# Patient Record
Sex: Female | Born: 1960 | Race: Black or African American | Hispanic: No | Smoking: Never smoker
Health system: Southern US, Community
[De-identification: ages and names within clinical notes are randomized; demographics above are authoritative.]

## PROBLEM LIST (undated history)

## (undated) DIAGNOSIS — B009 Herpesviral infection, unspecified: Secondary | ICD-10-CM

## (undated) DIAGNOSIS — R3129 Other microscopic hematuria: Secondary | ICD-10-CM

## (undated) DIAGNOSIS — E7800 Pure hypercholesterolemia, unspecified: Secondary | ICD-10-CM

## (undated) DIAGNOSIS — M1990 Unspecified osteoarthritis, unspecified site: Secondary | ICD-10-CM

## (undated) DIAGNOSIS — J45909 Unspecified asthma, uncomplicated: Secondary | ICD-10-CM

## (undated) DIAGNOSIS — L309 Dermatitis, unspecified: Secondary | ICD-10-CM

## (undated) HISTORY — DX: Herpesviral infection, unspecified: B00.9

## (undated) HISTORY — DX: Other microscopic hematuria: R31.29

## (undated) HISTORY — PX: FRACTURE SURGERY: SHX138

## (undated) HISTORY — DX: Unspecified asthma, uncomplicated: J45.909

---

## 1997-08-06 HISTORY — PX: HIP FRACTURE SURGERY: SHX118

## 1997-08-06 HISTORY — PX: WRIST FRACTURE SURGERY: SHX121

## 1998-11-14 HISTORY — PX: MYOMECTOMY ABDOMINAL APPROACH: SUR870

## 2001-12-19 ENCOUNTER — Encounter: Payer: Self-pay | Admitting: Obstetrics

## 2001-12-19 ENCOUNTER — Ambulatory Visit (HOSPITAL_COMMUNITY): Admission: RE | Admit: 2001-12-19 | Discharge: 2001-12-19 | Payer: Self-pay | Admitting: Obstetrics

## 2001-12-24 ENCOUNTER — Encounter: Payer: Self-pay | Admitting: Obstetrics

## 2001-12-24 ENCOUNTER — Encounter: Admission: RE | Admit: 2001-12-24 | Discharge: 2001-12-24 | Payer: Self-pay | Admitting: Obstetrics

## 2003-02-03 ENCOUNTER — Encounter: Payer: Self-pay | Admitting: Obstetrics

## 2003-02-03 ENCOUNTER — Ambulatory Visit (HOSPITAL_COMMUNITY): Admission: RE | Admit: 2003-02-03 | Discharge: 2003-02-03 | Payer: Self-pay | Admitting: Obstetrics

## 2004-06-22 ENCOUNTER — Ambulatory Visit (HOSPITAL_COMMUNITY): Admission: RE | Admit: 2004-06-22 | Discharge: 2004-06-22 | Payer: Self-pay | Admitting: Obstetrics

## 2005-07-06 ENCOUNTER — Ambulatory Visit (HOSPITAL_COMMUNITY): Admission: RE | Admit: 2005-07-06 | Discharge: 2005-07-06 | Payer: Self-pay | Admitting: Obstetrics

## 2006-04-05 ENCOUNTER — Other Ambulatory Visit: Admission: RE | Admit: 2006-04-05 | Discharge: 2006-04-05 | Payer: Self-pay | Admitting: Obstetrics & Gynecology

## 2007-06-30 ENCOUNTER — Other Ambulatory Visit: Admission: RE | Admit: 2007-06-30 | Discharge: 2007-06-30 | Payer: Self-pay | Admitting: Obstetrics and Gynecology

## 2007-11-27 ENCOUNTER — Emergency Department (HOSPITAL_COMMUNITY): Admission: EM | Admit: 2007-11-27 | Discharge: 2007-11-27 | Payer: Self-pay | Admitting: Family Medicine

## 2008-03-22 HISTORY — PX: LAPAROSCOPIC TOTAL HYSTERECTOMY: SUR800

## 2008-03-23 ENCOUNTER — Ambulatory Visit (HOSPITAL_COMMUNITY): Admission: RE | Admit: 2008-03-23 | Discharge: 2008-03-24 | Payer: Self-pay | Admitting: Obstetrics & Gynecology

## 2008-03-23 ENCOUNTER — Encounter: Payer: Self-pay | Admitting: Obstetrics & Gynecology

## 2008-05-06 ENCOUNTER — Ambulatory Visit (HOSPITAL_COMMUNITY): Admission: RE | Admit: 2008-05-06 | Discharge: 2008-05-06 | Payer: Self-pay | Admitting: Obstetrics & Gynecology

## 2008-06-11 ENCOUNTER — Emergency Department (HOSPITAL_COMMUNITY): Admission: EM | Admit: 2008-06-11 | Discharge: 2008-06-11 | Payer: Self-pay | Admitting: Emergency Medicine

## 2010-12-19 NOTE — Op Note (Signed)
Terry Salazar, Terry Salazar NO.:  [id number]   MEDICAL RECORD NO.:  [id number]          PATIENT TYPE:  AMB   LOCATION:  DAY                          FACILITY:  [facility]   PHYSICIAN:  M. Leda Quail, MD  DATE OF BIRTH:  [birth year]   DATE OF PROCEDURE:  03/23/2008  DATE OF DISCHARGE:                               OPERATIVE REPORT   PREOPERATIVE DIAGNOSES:  35. A 50 year old G3, P1, A2 African American female with fibroid      uterus.  2. Menorrhagia.  3. Anemia.  4. Prolapsed cervix.  5. History of myomectomy and round ligament uterine suspension done in      2000 in [place], [place].   POSTOPERATIVE DIAGNOSES:  100. A 50 year old G89, P55, A2 African American female with fibroid      uterus.  2. Menorrhagia.  3. Anemia.  4. Prolapsed cervix.  5. History of myomectomy and round ligament uterine suspension done in      2000 in [place], [place].   PROCEDURE:  Total laparoscopic hysterectomy using the Da Vinci system.   SURGEON:  M. Leda Quail, M.D. .   ASSISTANT:  Genia Del, M.D.   ANESTHESIA:  General endotracheal.   FINDINGS:  Large globular uterus with round ligament sutures to the  anterior abdominal wall from the round ligament suspension that was done  in 2000.   SPECIMENS:  Uterus and cervix sent to pathology.   ESTIMATED BLOOD LOSS:  50 mL.   URINE OUTPUT:  400 mL of clear urine output in the Foley catheter.   FLUIDS:  24 mL of LR.   COMPLICATIONS:  None.   INDICATIONS:  Ms. Kanno is a very nice, 50 year old, G2, P78, A2,  African American female with multiple fibroids in her uterus, resulting  menorrhagia, and anemia.  She has been taking oral contraceptives for  control of this, but she continues to have very heavy cycles and needs  to take iron to keep from getting very anemic.  Most recently, her  hemoglobin was around 11.  She did have a myomectomy performed in  [place] in 2000, but since that time, she has had a other fibroids  that  has continued to grow.  She reports that during that surgery she was  told that there were other smaller fibroids that may cause trouble in  the future.  She now over time has developed a prolapsed cervix which is  quite long, but the uterus is being held in place vaginally because of  its size.  We have discussed treatment options, and I have recommended a  TLH with leaving the uterosacral ligaments and keeping these attached to  the vaginal cuff.  The patient been counseled about risks and benefits.  Due to the size of the uterus, I do not feel comfortable that I can do  this laparoscopically or a LAVH or vaginally.  We have gone through also  other alternatives including a myomectomy or uterine artery  embolization, but she is ready at this time for definitive management.  The patient presents for this today.   PROCEDURE:  The patient is taken to the operating room.  Running IV is  present in the right arm.  She is placed in the supine position.  General endotracheal anesthesia was administered by the anesthesia staff  without difficulty.  Legs were positioned in the low lithotomy position  in [place] stirrups.  Legs are then positioned in high lithotomy position.  Abdomen, perineum, inner thighs and vagina were prepped in normal  sterile fashion.  The patient is then draped in a normal sterile  fashion.  Attention is turned initially toward the vagina.  A heavy  weighted speculum was the posterior aspect of the vagina.  The anterior  lip of cervix was grasped with a single-tooth tenaculum.  Uterus sounds  to 12 cm.  The Rumi uterine manipulator was obtained, and a #10 tip is  obtained.  This was attached to the [facility] uterine manipulator.  The bulb  did inflate easily with 5 mL of normal saline.  The vaginal occlusive  device was placed over top of this and then a small co-ring.  The  anterior lip of cervix was grasped with a single-tooth tenaculum, and  the uterus was sounded up  to 22.  The co-ring with the tip passed  through the endocervical canal and into the uterus.  Co-ring was then  placed snugly around the cervix, and the bulb was inflated inside the  uterus to hold the co-ring and Rumi uterine manipulator in place.  The  vaginal occlusive device is inflated at this time and placed vaginally.  Sixty mL of air is inflated in the vaginal occlusive device.  The Foley  catheter was inserted in the bladder and placed to straight drain.  Clear urine was noted.   Legs were then positioned to low-lithotomy position as far as they will  go.  Attention was turned toward the abdomen.  The location for the  camera report is identified.  Skin was anesthetized with 0.25% Marcaine  and a 12-mm skin incision made with a knife.  Subcutaneous and fat  tissue was dissected.  Curved S retractors were used to visualize the  fascia.  The fascia was grasped with Kocher clamps and elevated.  The  fascia was incised sharply with curved Mayo scissors.  The peritoneum  was identified, elevated and incised sharply with Metzenbaum scissors.  The operator's index fingers were placed in this incision.  A curved S  retractor was then placed to follow.  Intraperitoneal placement was  noted, and there was no omental or bowel adhesions noted.  Then, a  pursestring suture was placed on the fascia.  The midline port was then  placed.  This was held in place with the pursestring suture.  The  pneumoperitoneum was initially achieved under low pressures.  Once the  scope was used to visualize the pelvis, high pressures were then used to  maintain pneumoperitoneum.  Port sites were chosen for the 3 arms of the  assistant port.  These were placed in a semicircular fashion coming  about 10 cm out from the umbilical incision and then curving about 10 cm  out from the umbilical incision and then curving down about 10 cm on  each side.  The side ports are well above the level of the anterior  iliac  crest.  The skin was anesthetized with 0.25% Marcaine, and then 8-  mm skin incisions were made, 2 on the left side and the closest to the  location on the right, and then an 10-mm  skin incision was made for the  assistant port was further down on the right side.  Then under direct  visualization of the scope, three 8-mm robotic ports were placed.  These  were nonbladed tipped trocars.  Once they are placed under direct  visualization, the black thick line on the ports are placed just inside  the incisions.  Through the fourth incision was then placed a 12-mm  assistant port.  This was a nonbladed trocar as well, and this was  placed under direct visualization.  At this point, the patient was  placed in steep Trendelenburg.  The robot is docked from the side in  normal standard fashion, and the right #1 port was a monopolar scissors  and the left #2 port was PK bipolar cautery.  The third port was a Child psychotherapist, and the fourth is the assistant port.   At this point, the operator was moved to the console, and using the Da  Vinci system, the procedure was initiated.  The ureters were identified  bilaterally.  The round ligament on the right side was serially  cauterized, clamped and incised until it was completely transected.  The  broad ligament of the uterus was then incised to isolate the right utero-  ovarian pedicle.  This was serially clamped, cauterized and incised.  Sound indicator was used to ensure complete hemostasis of the pedicle.  Once this was performed, the anterior and posterior leaf of the broad  ligament on the right side were opened.  The anterior leaf was brought  down to the level of the internal os of the midline to begin the  creation of the bladder flap.  The posterior leaf of broad ligament was  then brought down posteriorly to the level of the uterosacral ligament.  The right uterine artery was well visualized at this point.  Attention  was then turned to the  left side.  In a similar fashion, the left round  ligament was serially clamped, transected and cauterized.  The left  uterine ovarian ligament was isolated, at this point, was serially  clamped, cauterized and transected.  The anterior and posterior leaf of  the peritoneum was then opened at this point.  The anterior leaf was  brought down to the level of the midline of the internal os, and using  sharp and blunt dissection, the filmy tissue between the bladder and the  cervix was dissected to open this plane and dissect the bladder off the  cervix without difficulty.  Once the bladder dissection was well below  the level of the co-ring, posterior leaf of broad ligament on the left  side was then opened, and this allowed the ureter drop laterally.  The  uterine artery at this point was well visualized on the left side.  It  was initially clamped at the level of the internal os.  This was above  the level of the co-ring.  It was clamped, cauterized and incised.  Then, keeping close to the cervix in the area where the broad ligament  is located, the PK Jola Babinski was clamped tight and cauterized and then  incised to bring the uterine artery down further on the cervix and  outside the level of the co-ring.  Attention was turned to the right  side.  In a similar fashion, the uterine artery was serially clamped and  cauterized at the level of the internal os.  Then, it was cut again at  the level of the cervix  high.  The PK [place] was used to clamp the  beginning of the broad ligament, cauterized and then incised in this  location as well.  This brought the uterine artery just outside the  level of the co-ring bilaterally.  Ureters were noted bilaterally  peristalsing out further and more lateral where this dissection  occurred.  Then using monopolar polar cautery, keeping on the co-ring  anteriorly, the colpotomy was begun.  This was carried around to the  right side of the uterus inside the  level of the uterine artery  dissection, then around posteriorly and then back to the left side,  again keeping inside the level of the uterine artery dissection.  Once  the cervix was completely freed from the vagina, the instruments were  removed, and the camera was brought back into the sleeve.  The patient  was kept in steep Trendelenburg.  The #1 arm was undocked.  Legs were  positioned in the dorsal lithotomy position.  Attention was turned  toward the vagina.  Sterile gown and gloves were applied to the  operator.  Heavy weighted speculum was placed in the vagina, and the  curved Deaver's were used as sidewall retractors.  The Rumi uterine  manipulator tip was deflated, and the co-ring was removed as well as the  vaginal occlusive device.  Vaginal occlusive device was taken off the  Rumi uterine manipulator to be used later.  Then, the cervix was cored  out with a #10 blade to begin morcellation of the uterus.  With care  taken to watch the sidewalls of the vagina, the uterus was morcellated  in pieces with fibroids being brought out.  Once the uterus was  morcellated to a small enough size, the entire remainder of the uterus  could be delivered through the vagina.  Once this was done, the vaginal  occlusive device was placed back inside the vagina.  The legs were  brought back down to the low-lithotomy position.  The first arm was  redocked on the patient, and the operator went back to the console.  The  pneumoperitoneum had been kept throughout the vaginal portion of this  procedure.  In the right arm at this point, a suture cut was obtained,  in the left arm, a needle driver and then finally in the third arm a PK  Jola Babinski.  Then, figure-of-eight sutures were placed at the vaginal  corners to begin the closing of the cuff.  Care was taken below the  location of the bladder anteriorly.  Anterior and posterior vaginal  mucosa were incorporated into all stitches.  Three additional  figure-of-  eight sutures were used to completely close the cuff vaginally.  The  pelvis was irrigated.  No bleeding was noted.  Ureter peristalsis was  noted bilaterally.  Excellent hemostasis was noted of all pedicles.  At  this point, all instruments were removed from the abdomen.  The  pneumoperitoneum was initially released, and the cuff was visualized  without any bleeding being noted.  Then, the assistant port and 3  additional ports were removed under direct visualization of the  laparoscope.  Laparoscope was removed from the midline, and finally the  patient was completely taken out of Trendelenburg.  The midline port was  removed.  C.R.N.A. gave the patient several deep breaths to try and get  any air out the abdomen.  Then, this port was removed.  The operator's  index finger was placed in the incision to ensure that  there was no  bowel present.  The pursestring suture was then tied tightly to close  this incision.  The incisions were cleansed, and the Bovie was used to  stop any bleeding inside the incisions.  The 2 large incisions were  closed with a subcuticular stitch of 3-0 Vicryl.  The rest of the  incisions were then closed with Dermabond.  At this point, the procedure  was ended except for the vaginal occlusive device needed to be removed  which was done without difficulty.   The patient was placed back in supine position.  She was awakened from  anesthesia without difficulty and extubated.  Sponge, lap, needle and  instrument counts were correct x2.  The patient was taken to the  recovery room in stable condition.      Lum Keas, MD  Electronically Signed     MSM/MEDQ  D:  03/23/2008  T:  03/23/2008  Job:  [id number]

## 2010-12-19 NOTE — Discharge Summary (Signed)
NAMEDOTTY, GONZALO NO.:  [id number]   MEDICAL RECORD NO.:  [id number]          PATIENT TYPE:  OIB   LOCATION:  1530                         [facility]AC[facility]   PHYSICIAN:  M. Leda Quail, MD  DATE OF BIRTH:  Jan 09, 1961   DATE OF ADMISSION:  03/23/2008  DATE OF DISCHARGE:  03/24/2008                               DISCHARGE SUMMARY   ADMISSION DIAGNOSES:  54. A 50 year old G3, P1, A2 African American female with fibroid      uterus.  2. Menorrhagia.  3. Prolapsed cervix.  4. History of myomectomy.  5. Anemia.   DISCHARGE DIAGNOSES:  36. A 50 year old G46, P74, A2 African American female with fibroid      uterus.  2. Menorrhagia.  3. Prolapsed cervix.  4. History of myomectomy.  5. Anemia.   PROCEDURE:  Total laparoscopic hysterectomy using the [employer] system.   HISTORY OF PRESENT ILLNESS:  A written H&P is in the chart but in brief,  Ms. Kessen is a very nice 50 year old African American female who has  been dealing with fibroids and menorrhagia for many years.  In 2000, she  underwent a myomectomy as well as a round ligament suspension because  she was having issues then with uterine prolapse.  She was told then  that she would probably have to have something done later because of the  number of fibroids that were in the uterus.  At that time, however, the  2 largest ones were removes and the smaller ones were left.  She has  been on birth control for cycle control and this has improved her cycle  some but she still does have definite menorrhagia and has had trouble  with anemia.  She does take iron and multivitamins to help prevent this.  Over the last year or two, she has had more issues with prolapsed  cervix.  Because of the size of her uterus, she does not have uterus  prolapse.  The cervix has gotten quite long.  We discussed treatment  options and based on the 2 issues of the prolapsed cervix and her  enlarged uterus secondary to fibroids and  menorrhagia, I recommended  preceding either with the myomectomy again or a hysterectomy.  She is  done with childbearing and is not in a long-term relationship at this  time.  She is not interested in having any more children and is ready to  proceed with hysterectomy.  She had been counseled about the risks and  benefits and this is all documented in her office chart.   HOSPITAL COURSE:  Patient was admitted through same day surgery.  She  was taken to the operating room where a TLH using the [employer] system  was performed.  Blood loss for the surgery was 50 mL.  She was very  stable during the procedure.  She had a benign operative course and  after the surgery, was taken to the recovery room and from there to the  5th floor for the remainder of her hospitalization.  She has done well  in the  last 24 hours.  She has been afebrile with stable vital signs.  Blood pressures have been normal.  She has had almost 3000 mL of urine  output.   Labs this morning showed a potassium of 4.2, sodium 139, BUN 9,  creatinine 0.8, glucose 125.  White count 7.4, hemoglobin 11, starting  hemoglobin was 11.3, platelet count 226.   Her exam is completely benign.  She has excellent bowel sounds and her  incisions are clean, dry and intact and the perineum is dry.  She has  already been advanced to a regular diet and she has been up and  ambulated.  Her Foley catheter has been removed this morning.  I am  waiting to see if she voids.  She has done well with pain management and  will be switched to oral pain medicines this morning.  I anticipate  discharge to home later today.  She has been given discharge  instructions in the written and verbal form.  She will see me in 1 week.  The patient is to call if she has any issues and I will check on her  later in the week as well.      Lum Keas, MD  Electronically Signed     MSM/MEDQ  D:  03/24/2008  T:  03/24/2008  Job:  [id number]

## 2011-05-01 LAB — POCT RAPID STREP A: Streptococcus, Group A Screen (Direct): NEGATIVE

## 2011-05-04 LAB — URINALYSIS, ROUTINE W REFLEX MICROSCOPIC
Glucose, UA: NEGATIVE
Leukocytes, UA: NEGATIVE
pH: 7

## 2011-05-04 LAB — URINE MICROSCOPIC-ADD ON

## 2011-05-04 LAB — CBC
Hemoglobin: 11.3 — ABNORMAL LOW
MCHC: 33.1
MCV: 88.5
Platelets: 239
RBC: 3.84 — ABNORMAL LOW
RDW: 14.2
WBC: 4.3

## 2011-05-04 LAB — BASIC METABOLIC PANEL
CO2: 26
Calcium: 9.3
GFR calc Af Amer: >60
GFR calc non Af Amer: >60
Sodium: 139

## 2012-08-06 DIAGNOSIS — R3129 Other microscopic hematuria: Secondary | ICD-10-CM

## 2012-08-06 HISTORY — DX: Other microscopic hematuria: R31.29

## 2012-08-14 ENCOUNTER — Encounter (HOSPITAL_COMMUNITY): Payer: Self-pay | Admitting: *Deleted

## 2012-08-14 ENCOUNTER — Emergency Department (HOSPITAL_COMMUNITY)
Admission: EM | Admit: 2012-08-14 | Discharge: 2012-08-14 | Disposition: A | Discharge status: Home / Self Care | Payer: 59 | Attending: Emergency Medicine | Admitting: Emergency Medicine

## 2012-08-14 DIAGNOSIS — T628X1A Toxic effect of other specified noxious substances eaten as food, accidental (unintentional), initial encounter: Secondary | ICD-10-CM | POA: Insufficient documentation

## 2012-08-14 DIAGNOSIS — Y929 Unspecified place or not applicable: Secondary | ICD-10-CM | POA: Insufficient documentation

## 2012-08-14 DIAGNOSIS — T7840XA Allergy, unspecified, initial encounter: Secondary | ICD-10-CM

## 2012-08-14 DIAGNOSIS — R220 Localized swelling, mass and lump, head: Secondary | ICD-10-CM | POA: Insufficient documentation

## 2012-08-14 DIAGNOSIS — R21 Rash and other nonspecific skin eruption: Secondary | ICD-10-CM | POA: Insufficient documentation

## 2012-08-14 DIAGNOSIS — Y939 Activity, unspecified: Secondary | ICD-10-CM | POA: Insufficient documentation

## 2012-08-14 MED ORDER — EPINEPHRINE HCL 1 MG/ML IJ SOLN
0.3000 mg | Freq: Once | INTRAMUSCULAR | Status: AC
  Administered 2012-08-14: 0.3 mg via INTRAMUSCULAR
  Filled 2012-08-14: qty 1

## 2012-08-14 MED ORDER — PREDNISONE 20 MG PO TABS
60.0000 mg | ORAL_TABLET | Freq: Once | ORAL | Status: AC
  Administered 2012-08-14: 60 mg via ORAL
  Filled 2012-08-14: qty 3

## 2012-08-14 NOTE — ED Notes (Addendum)
Allergic reaction to ? Shrimp. Bilateral swollen, raised areas of the face. No tongue swelling, lips are swelling, no airway compromise. Started 30 minutes ago. Did take vistaril 50 mg, zyrtec 10 mg, and benadryl 25 mg. Does see improvement.

## 2012-08-14 NOTE — ED Provider Notes (Signed)
History     CSN: [id number]  Arrival date & time 08/14/12  1927   First MD Initiated Contact with Patient 08/14/12 1953      Chief Complaint  Patient presents with  . Allergic Reaction    (Consider location/radiation/quality/duration/timing/severity/associated sxs/prior treatment) Patient is a 52 y.o. female presenting with allergic reaction. The history is provided by the patient. No language interpreter was used.  Allergic Reaction The primary symptoms are  rash and urticaria. The primary symptoms do not include shortness of breath. The current episode started less than 1 hour ago. The problem has not changed since onset. The rash is associated with itching.  The urticaria began less than 1 hour ago. The urticaria has been unchanged since its onset. Urticaria is located on the face (Upper chest, upper back.). Associated with: Eating shrimp.  The onset of the reaction was associated with eating. Significant symptoms also include itching.    History reviewed. No pertinent past medical history.  History reviewed. No pertinent past surgical history.  No family history on file.  History  Substance Use Topics  . Smoking status: Not on file  . Smokeless tobacco: Not on file  . Alcohol Use: Yes    OB History    Grav Para Term Preterm Abortions TAB SAB Ect Mult Living                  Review of Systems  Constitutional: Negative.  Negative for chills.  HENT: Positive for facial swelling.   Eyes: Negative.   Respiratory: Negative.  Negative for chest tightness and shortness of breath.   Cardiovascular: Negative.   Gastrointestinal: Negative.   Genitourinary: Negative.   Musculoskeletal: Negative.   Skin: Positive for itching and rash.  Neurological: Negative.   Psychiatric/Behavioral: Negative.     Allergies  Shrimp  Home Medications  No current outpatient prescriptions on file.  BP 149/90  Pulse 74  Temp 97.8 F (36.6 C) (Oral)  SpO2 98%  Physical Exam    Nursing note and vitals reviewed. Constitutional: She is oriented to person, place, and time. She appears well-developed and well-nourished. Distressed: in mild distress with urticarial rash.  HENT:  Head: Normocephalic and atraumatic.  Right Ear: External ear normal.  Left Ear: External ear normal.  Mouth/Throat: Oropharynx is clear and moist.  Eyes: Conjunctivae normal and EOM are normal. Pupils are equal, round, and reactive to light.  Neck: Normal range of motion. Neck supple.  Cardiovascular: Normal rate and regular rhythm.   Pulmonary/Chest: Effort normal and breath sounds normal.  Abdominal: Soft. Bowel sounds are normal.  Musculoskeletal: Normal range of motion.  Neurological: She is alert and oriented to person, place, and time.       No sensory or motor deficit.  Skin: Skin is warm and dry.       Urticaria on face, neck, upper chest and upper back.  Psychiatric: She has a normal mood and affect. Her behavior is normal.    ED Course  Procedures (including critical care time)  7:56 PM Pt had taken Vistaril, Benadryl, and cetirizine at home without relief.  I ordered epinephrine 0.3 cc im and prednisone 60 mg po.  9:24 PM Much improved  Rx with prednisone taper, hydroxyzine for itching, EpiPen if needed for an acute allergic reaction.  1. Allergic reaction          Carleene Cooper III, MD 08/14/12 2126

## 2012-09-30 ENCOUNTER — Encounter: Payer: Self-pay | Admitting: Nurse Practitioner

## 2012-11-06 ENCOUNTER — Encounter: Payer: Self-pay | Admitting: Nurse Practitioner

## 2012-11-06 ENCOUNTER — Ambulatory Visit: Payer: Self-pay | Admitting: Nurse Practitioner

## 2012-11-06 DIAGNOSIS — Z01419 Encounter for gynecological examination (general) (routine) without abnormal findings: Secondary | ICD-10-CM

## 2013-10-09 ENCOUNTER — Other Ambulatory Visit: Payer: Self-pay | Admitting: Internal Medicine

## 2013-10-09 DIAGNOSIS — Z1231 Encounter for screening mammogram for malignant neoplasm of breast: Secondary | ICD-10-CM

## 2013-10-23 ENCOUNTER — Ambulatory Visit
Admission: RE | Admit: 2013-10-23 | Discharge: 2013-10-23 | Disposition: A | Discharge status: Home / Self Care | Payer: 59 | Source: Ambulatory Visit | Attending: Internal Medicine | Admitting: Internal Medicine

## 2013-10-23 DIAGNOSIS — Z1231 Encounter for screening mammogram for malignant neoplasm of breast: Secondary | ICD-10-CM

## 2013-12-28 ENCOUNTER — Encounter (HOSPITAL_COMMUNITY): Payer: Self-pay | Admitting: Emergency Medicine

## 2013-12-28 ENCOUNTER — Emergency Department (HOSPITAL_COMMUNITY)
Admission: EM | Admit: 2013-12-28 | Discharge: 2013-12-28 | Disposition: A | Discharge status: Home / Self Care | Payer: 59 | Source: Home / Self Care | Attending: Family Medicine | Admitting: Family Medicine

## 2013-12-28 DIAGNOSIS — H9319 Tinnitus, unspecified ear: Secondary | ICD-10-CM

## 2013-12-28 HISTORY — DX: Pure hypercholesterolemia, unspecified: E78.00

## 2013-12-28 NOTE — ED Notes (Signed)
C/o ringing in her R ear onset 1100.  No pain.  Sound is also muffled.

## 2013-12-28 NOTE — ED Provider Notes (Signed)
Terry Salazar is a 53 y.o. female who presents to Urgent Care today for right ear tinnitus starting this morning at around 11:00 associated with muffled hearing. No dizziness lightheadedness weakness or numbness. No fevers chills nausea vomiting or diarrhea. No injury or recent exposure to loud noises. She feels well otherwise.   Past Medical History  Diagnosis Date  . Fibroids 03/03/08    multiple intramural,subserosal found on PUS   . 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 Asthma   . High cholesterol    History  Substance Use Topics  . Smoking status: Never Smoker   . Smokeless tobacco: Never Used  . Alcohol Use: Yes     Comment: occasional   ROS as above Medications: No current facility-administered medications for this encounter.   Current Outpatient Prescriptions  Medication Sig Dispense Refill  . atorvastatin (LIPITOR) 10 MG tablet Take 10 mg by mouth daily.      . ATORVASTATIN CALCIUM PO Take 10 mg by mouth daily.       . cetirizine (ZYRTEC) 10 MG tablet Take 10 mg by mouth daily.      Marland Kitchen zolpidem (AMBIEN) 5 MG tablet Take 5 mg by mouth at bedtime as needed. For sleep      . aspirin 81 MG tablet Take 81 mg by mouth daily.      . diphenhydrAMINE (BENADRYL) 25 mg capsule Take 25 mg by mouth every 6 (six) hours as needed. For allergies      . hydrOXYzine (ATARAX/VISTARIL) 25 MG tablet Take 25 mg by mouth 3 (three) times daily as needed. For itching      . valACYclovir (VALTREX) 500 MG tablet Take 500 mg by mouth 1 day or 1 dose.        Exam:  BP 141/88  Pulse 75  Temp(Src) 98.1 F (36.7 C) (Oral)  Resp 16  SpO2 98%  Gen: Well NAD HEENT: EOMI,  MMM tympanic membranes are normal appearing bilaterally and nontender mastoids bilaterally. Lungs: Normal work of breathing. CTABL Heart: RRR no MRG Abd: NABS, Soft. NT, ND Exts: Brisk capillary refill, warm and well perfused.   No results found for this or any previous visit (from the past 24 hour(s)). No results  found.  Assessment and Plan: 53 y.o. female with tinnitus. Unclear etiology at this point. No other neurologic abnormalities. Referral to ENT.  Discussed warning signs or symptoms. Please see discharge instructions. Patient expresses understanding.    Gregor Hams, MD 12/28/13 2040

## 2013-12-28 NOTE — Discharge Instructions (Signed)
Thank you for coming in today. Follow up with [facility]  Tinnitus Sounds you hear in your ears and coming from within the ear is called tinnitus. This can be a symptom of many ear disorders. It is often associated with hearing loss.  Tinnitus can be seen with:  Infections.  Ear blockages such as wax buildup.  Meniere's disease.  Ear damage.  Inherited.  Occupational causes. While irritating, it is not usually a threat to health. When the cause of the tinnitus is wax, infection in the middle ear, or foreign body it is easily treated. Hearing loss will usually be reversible.  TREATMENT  When treating the underlying cause does not get rid of tinnitus, it may be necessary to get rid of the unwanted sound by covering it up with more pleasant background noises. This may include music, the radio etc. There are tinnitus maskers which can be worn which produce background noise to cover up the tinnitus. Avoid all medications which tend to make tinnitus worse such as alcohol, caffeine, aspirin, and nicotine. There are many soothing background tapes such as rain, ocean, thunderstorms, etc. These soothing sounds help with sleeping or resting. Keep all follow-up appointments and referrals. This is important to identify the cause of the problem. It also helps avoid complications, impaired hearing, disability, or chronic pain. Document Released: 07/23/2005 Document Revised: 10/15/2011 Document Reviewed: 03/10/2008 [facility] Patient Information 2014 [place].

## 2014-02-23 ENCOUNTER — Emergency Department (HOSPITAL_COMMUNITY)
Admission: EM | Admit: 2014-02-23 | Discharge: 2014-02-23 | Disposition: A | Discharge status: Home / Self Care | Payer: 59 | Source: Home / Self Care | Attending: Family Medicine | Admitting: Family Medicine

## 2014-02-23 ENCOUNTER — Encounter (HOSPITAL_COMMUNITY): Payer: Self-pay | Admitting: Emergency Medicine

## 2014-02-23 DIAGNOSIS — J069 Acute upper respiratory infection, unspecified: Secondary | ICD-10-CM

## 2014-02-23 LAB — POCT RAPID STREP A: STREPTOCOCCUS, GROUP A SCREEN (DIRECT): NEGATIVE

## 2014-02-23 NOTE — Discharge Instructions (Signed)
Drink plenty of fluids as discussed, use lozenges, gargle and mucinex or delsym for cough. Return or see your doctor if further problems °

## 2014-02-23 NOTE — ED Notes (Signed)
Pt  Reports  Symptoms  Of  sorethroat   X      1  Day       Pt  Sitting  Upright  On  Exam table            Speaking  In  Complete  sentances          Skin is  Warm   And  Dry
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
## 2014-02-25 LAB — CULTURE, GROUP A STREP

## 2014-06-07 ENCOUNTER — Encounter (HOSPITAL_COMMUNITY): Payer: Self-pay | Admitting: Emergency Medicine

## 2014-08-02 ENCOUNTER — Other Ambulatory Visit: Payer: Self-pay | Admitting: Obstetrics & Gynecology

## 2014-08-02 NOTE — Telephone Encounter (Signed)
Medication refill request: Valtrex 500 mg  Last AEX:  11/01/11 with Ms. Patty Next AEX: 08/18/14 with Ms. Patty Last MMG (if hormonal medication request): N/A  Refill authorized: #[id number] rfs, please advise.  (Chart In Your Door)

## 2014-08-18 ENCOUNTER — Ambulatory Visit: Payer: Self-pay | Admitting: Nurse Practitioner

## 2014-08-18 ENCOUNTER — Encounter: Payer: Self-pay | Admitting: Nurse Practitioner

## 2014-09-10 ENCOUNTER — Ambulatory Visit (INDEPENDENT_AMBULATORY_CARE_PROVIDER_SITE_OTHER): Payer: 59 | Admitting: Obstetrics & Gynecology

## 2014-09-10 ENCOUNTER — Encounter: Payer: Self-pay | Admitting: Obstetrics & Gynecology

## 2014-09-10 VITALS — BP 142/82 | HR 60 | Resp 16 | Ht 71.5 in | Wt 257.0 lb

## 2014-09-10 DIAGNOSIS — Z0000 Encounter for general adult medical examination without abnormal findings: Secondary | ICD-10-CM

## 2014-09-10 DIAGNOSIS — Z124 Encounter for screening for malignant neoplasm of cervix: Secondary | ICD-10-CM

## 2014-09-10 DIAGNOSIS — Z01419 Encounter for gynecological examination (general) (routine) without abnormal findings: Secondary | ICD-10-CM

## 2014-09-10 LAB — POCT URINALYSIS DIPSTICK
BILIRUBIN UA: NEGATIVE
GLUCOSE UA: NEGATIVE
KETONES UA: NEGATIVE
LEUKOCYTES UA: NEGATIVE
Nitrite, UA: NEGATIVE
Urobilinogen, UA: NEGATIVE
pH, UA: 5

## 2014-09-10 LAB — LIPID PANEL
CHOL/HDL RATIO: 2.8 ratio
CHOLESTEROL: 202 mg/dL — AB (ref 0–200)
HDL: 73 mg/dL (ref >39)
LDL CALC: 110 mg/dL — AB (ref 0–99)
Triglycerides: 96 mg/dL (ref <150)
VLDL: 19 mg/dL (ref 0–40)

## 2014-09-10 LAB — CBC
HEMATOCRIT: 34.9 % — AB (ref 36.0–46.0)
Hemoglobin: 11.8 g/dL — ABNORMAL LOW (ref 12.0–15.0)
MCH: 29 pg (ref 26.0–34.0)
MCHC: 33.8 g/dL (ref 30.0–36.0)
MCV: 85.7 fL (ref 78.0–100.0)
MPV: 9.6 fL (ref 8.6–12.4)
Platelets: 274 10*3/uL (ref 150–400)
RBC: 4.07 MIL/uL (ref 3.87–5.11)
RDW: 14.7 % (ref 11.5–15.5)
WBC: 5.1 10*3/uL (ref 4.0–10.5)

## 2014-09-10 LAB — COMPREHENSIVE METABOLIC PANEL
ALBUMIN: 4.6 g/dL (ref 3.5–5.2)
ALK PHOS: 81 U/L (ref 39–117)
ALT: 33 U/L (ref 0–35)
AST: 30 U/L (ref 0–37)
BUN: 17 mg/dL (ref 6–23)
CALCIUM: 9.3 mg/dL (ref 8.4–10.5)
CHLORIDE: 103 meq/L (ref 96–112)
CO2: 25 meq/L (ref 19–32)
CREATININE: 0.9 mg/dL (ref 0.50–1.10)
GLUCOSE: 91 mg/dL (ref 70–99)
POTASSIUM: 4.3 meq/L (ref 3.5–5.3)
Sodium: 139 mEq/L (ref 135–145)
TOTAL PROTEIN: 8 g/dL (ref 6.0–8.3)
Total Bilirubin: 0.4 mg/dL (ref 0.2–1.2)

## 2014-09-10 MED ORDER — VALACYCLOVIR HCL 500 MG PO TABS: 500.0000 mg | ORAL_TABLET | Freq: Every day | ORAL | Status: DC
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
## 2014-09-10 NOTE — Patient Instructions (Signed)
Terry Salazar--Dermatology, [facility]

## 2014-09-11 LAB — TSH: TSH: 2 u[IU]/mL (ref 0.350–4.500)

## 2014-09-13 ENCOUNTER — Telehealth: Payer: Self-pay

## 2014-09-13 LAB — HEMOGLOBIN, FINGERSTICK: Hemoglobin, fingerstick: 11.5 g/dL — ABNORMAL LOW (ref 12.0–16.0)

## 2014-09-13 NOTE — Telephone Encounter (Signed)
Lmtcb//kn 

## 2014-09-13 NOTE — Telephone Encounter (Signed)
-----   Message from Lyman Speller, MD sent at 09/10/2014  5:52 PM EST ----- Obtained records from Dr. Dagmar Hait (pt's PCP) showing 2+/3+ blood in u/a's.  Also prior work-up from Dr. Matilde Sprang also obtained.  Pt did have cystoscopy and CT.

## 2014-09-15 NOTE — Addendum Note (Signed)
Addended by: Megan Salon on: 09/15/2014 10:01 AM   Modules accepted: Orders, SmartSet

## 2014-09-16 NOTE — Telephone Encounter (Signed)
Patient notified of all results. See result note.//kn

## 2014-09-16 NOTE — Telephone Encounter (Signed)
2/11 lmtcb//kn

## 2014-09-16 NOTE — Telephone Encounter (Signed)
-----   Message from Lyman Speller, MD sent at 09/15/2014  9:54 AM EST ----- Inform pt CMP and TSH were normal.  Hb was 11.8--very mildly low.  I don't think she needs iron but a MVI daily would be good especially with her upcoming surgery.  Her cholesterol is mildly elevated but not in any range that needs treatment.  TGs normal.  HDLs great.  Pt will need copies as she stated during her AEX to take with her to the [place]

## 2014-09-21 LAB — IPS PAP TEST WITH REFLEX TO HPV

## 2014-09-24 ENCOUNTER — Telehealth: Payer: Self-pay

## 2014-09-24 ENCOUNTER — Telehealth: Payer: Self-pay | Admitting: Obstetrics & Gynecology

## 2014-09-24 MED ORDER — CONCEPT OB 130-92.4-1 MG PO CAPS: 1.0000 | ORAL_CAPSULE | ORAL | Status: DC

## 2014-09-24 NOTE — Telephone Encounter (Signed)
Patient calling requesting an Rx for: "[facility] to help with low iron."  [facility]

## 2014-09-24 NOTE — Telephone Encounter (Signed)
Order signed.  Thank you.  Please let pt know.  Then ok to close encounter.

## 2014-09-24 NOTE — Telephone Encounter (Signed)
-----   Message from Lyman Speller, MD sent at 09/21/2014  6:09 PM EST ----- Please call pt and let her know her pap showed ASCUS but her HR HPV was negative.  This is a normal pap so she doesn't need to do anything.  I did this because there was HPV associated change with her hysterectomy.  The HPV testing was negative and that was really what I was most concerned with at this time.  Thanks.  02 recall.

## 2014-09-24 NOTE — Telephone Encounter (Signed)
Returned call

## 2014-09-24 NOTE — Telephone Encounter (Signed)
Lmtcb//kn 

## 2014-09-24 NOTE — Telephone Encounter (Signed)
Patient is calling to request that Dr. Sabra Heck place order for prescription Iron pills. Patient states that she was told this rx specifically will help increase iron quickly. Patient is planning procedure in [place] and is worried she may be denied for procedure based on her levels. Advised patient that per Dr. Sabra Heck rx for iron not indicated at this time. However, patient requests that this rx be placed so she can at least try to get level up prior to procedure on 10/07/14. Advised would send her request to Dr. Sabra Heck

## 2014-09-27 NOTE — Telephone Encounter (Signed)
Spoke with patient. She states she will be holding off on surgery but will hold on to rx just in case. She is requesting a reminder for a provider at [facility] That Dr. Sabra Heck mentioned.  Reviewed Dermatologists at [facility] and patient agrees it is Amy McMichael.  Number given, she will call. Routing to provider for final review. Patient agreeable to disposition. Will close encounter

## 2014-09-28 NOTE — Telephone Encounter (Signed)
Patient was given results by T. Fast, RN//kn

## 2014-10-28 ENCOUNTER — Encounter: Payer: Self-pay | Admitting: Neurology

## 2014-10-28 ENCOUNTER — Ambulatory Visit (INDEPENDENT_AMBULATORY_CARE_PROVIDER_SITE_OTHER): Payer: 59 | Admitting: Neurology

## 2014-10-28 VITALS — BP 134/82 | HR 83 | Resp 14 | Ht 72.0 in | Wt 261.6 lb

## 2014-10-28 DIAGNOSIS — R5383 Other fatigue: Secondary | ICD-10-CM

## 2014-10-28 DIAGNOSIS — R0683 Snoring: Secondary | ICD-10-CM | POA: Diagnosis not present

## 2014-10-28 DIAGNOSIS — G479 Sleep disorder, unspecified: Secondary | ICD-10-CM | POA: Diagnosis not present

## 2014-10-28 DIAGNOSIS — G4726 Circadian rhythm sleep disorder, shift work type: Secondary | ICD-10-CM

## 2014-10-28 MED ORDER — ZALEPLON 10 MG PO CAPS: 10.0000 mg | ORAL_CAPSULE | Freq: Every evening | ORAL | Status: DC | PRN

## 2014-10-28 MED ORDER — MELATONIN 3 MG PO TABS: 3.0000 mg | ORAL_TABLET | ORAL | Status: DC
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
## 2014-11-02 ENCOUNTER — Encounter: Payer: 59 | Admitting: *Deleted

## 2014-11-09 ENCOUNTER — Ambulatory Visit (INDEPENDENT_AMBULATORY_CARE_PROVIDER_SITE_OTHER): Payer: 59 | Admitting: Neurology

## 2014-11-09 DIAGNOSIS — G479 Sleep disorder, unspecified: Secondary | ICD-10-CM

## 2014-11-09 DIAGNOSIS — G4720 Circadian rhythm sleep disorder, unspecified type: Secondary | ICD-10-CM | POA: Diagnosis not present

## 2014-11-09 DIAGNOSIS — R5383 Other fatigue: Secondary | ICD-10-CM

## 2014-11-09 DIAGNOSIS — R0683 Snoring: Secondary | ICD-10-CM

## 2014-11-09 DIAGNOSIS — G4726 Circadian rhythm sleep disorder, shift work type: Secondary | ICD-10-CM

## 2014-11-09 NOTE — Sleep Study (Signed)
Please see the scanned sleep study interpretation located in the Procedure tab within the Chart Review section. 

## 2014-12-10 ENCOUNTER — Telehealth: Payer: Self-pay | Admitting: Neurology

## 2014-12-10 NOTE — Telephone Encounter (Signed)
Patient calling to get results from home sleep study

## 2014-12-13 NOTE — Telephone Encounter (Signed)
Called pt to inform her that I do not have the results of the sleep study yet but will call her when I do. Explained that Dr. Brett Fairy was out of the office for several weeks. Pt understood.

## 2014-12-15 ENCOUNTER — Other Ambulatory Visit: Payer: Self-pay | Admitting: General Surgery

## 2014-12-30 ENCOUNTER — Ambulatory Visit: Payer: 59 | Attending: Sports Medicine | Admitting: Physical Therapy

## 2014-12-30 ENCOUNTER — Encounter: Payer: Self-pay | Admitting: Physical Therapy

## 2014-12-30 DIAGNOSIS — M436 Torticollis: Secondary | ICD-10-CM | POA: Insufficient documentation

## 2014-12-30 DIAGNOSIS — M792 Neuralgia and neuritis, unspecified: Secondary | ICD-10-CM | POA: Insufficient documentation

## 2014-12-30 DIAGNOSIS — M542 Cervicalgia: Secondary | ICD-10-CM | POA: Insufficient documentation
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
## 2014-12-30 NOTE — Patient Instructions (Signed)
1.  Sitting posture awareness  2.  Stoplight system:  Red light=peripheral symptoms STOP                                    Green light= no distal symptoms GOOD  3.  Heat or Ice is good  4.  Try sidebending right or left to look for a preference.

## 2015-01-13 ENCOUNTER — Ambulatory Visit: Payer: 59 | Attending: Sports Medicine | Admitting: Physical Therapy

## 2015-01-13 DIAGNOSIS — M792 Neuralgia and neuritis, unspecified: Secondary | ICD-10-CM | POA: Diagnosis present

## 2015-01-13 DIAGNOSIS — M542 Cervicalgia: Secondary | ICD-10-CM | POA: Diagnosis not present

## 2015-01-13 DIAGNOSIS — M436 Torticollis: Secondary | ICD-10-CM | POA: Insufficient documentation
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
## 2015-01-13 NOTE — Patient Instructions (Addendum)
Sleeping on Back  Place pillow under knees. A pillow with cervical support and a roll around waist are also helpful. Copyright  VHI. All rights reserved.  Sleeping on Side Place pillow between knees. Use cervical support under neck and a roll around waist as needed. Copyright  VHI. All rights reserved.   Sleeping on Stomach   If this is the only desirable sleeping position, place pillow under lower legs, and under stomach or chest as needed.  Posture - Sitting   Sit upright, head facing forward. Try using a roll to support lower back. Keep shoulders relaxed, and avoid rounded back. Keep hips level with knees. Avoid crossing legs for long periods. Stand to Sit / Sit to Stand   To sit: Bend knees to lower self onto front edge of chair, then scoot back on seat. To stand: Reverse sequence by placing one foot forward, and scoot to front of seat. Use rocking motion to stand up.   Work Height and Reach  Ideal work height is no more than 2 to 4 inches below elbow level when standing, and at elbow level when sitting. Reaching should be limited to arm's length, with elbows slightly bent.  Bending  Bend at hips and knees, not back. Keep feet shoulder-width apart.    Posture - Standing   Good posture is important. Avoid slouching and forward head thrust. Maintain curve in low back and align ears over shoul- ders, hips over ankles.  Alternating Positions   Alternate tasks and change positions frequently to reduce fatigue and muscle tension. Take rest breaks. Computer Work   Position work to face forward. Use proper work and seat height. Keep shoulders back and down, wrists straight, and elbows at right angles. Use chair that provides full back support. Add footrest and lumbar roll as needed.  Getting Into / Out of Car  Lower self onto seat, scoot back, then bring in one leg at a time. Reverse sequence to get out.  Dressing  Lie on back to pull socks or slacks over feet, or sit  and bend leg while keeping back straight.    Housework - Sink  Place one foot on ledge of cabinet under sink when standing at sink for prolonged periods.   Pushing / Pulling  Pushing is preferable to pulling. Keep back in proper alignment, and use leg muscles to do the work.  Deep Squat   Squat and lift with both arms held against upper trunk. Tighten stomach muscles without holding breath. Use smooth movements to avoid jerking.  Avoid Twisting   Avoid twisting or bending back. Pivot around using foot movements, and bend at knees if needed when reaching for articles.  Carrying Luggage   Distribute weight evenly on both sides. Use a cart whenever possible. Do not twist trunk. Move body as a unit.   Lifting Principles .Maintain proper posture and head alignment. .Slide object as close as possible before lifting. .Move obstacles out of the way. .Test before lifting; ask for help if too heavy. .Tighten stomach muscles without holding breath. .Use smooth movements; do not jerk. .Use legs to do the work, and pivot with feet. .Distribute the work load symmetrically and close to the center of trunk. .Push instead of pull whenever possible.   Ask For Help   Ask for help and delegate to others when possible. Coordinate your movements when lifting together, and maintain the low back curve.  Log Roll   Lying on back, bend left knee and place left   arm across chest. Roll all in one movement to the right. Reverse to roll to the left. Always move as one unit. Housework - Sweeping  Use long-handled equipment to avoid stooping.   Housework - Wiping  Position yourself as close as possible to reach work surface. Avoid straining your back.  Laundry - Unloading Wash   To unload small items at bottom of washer, lift leg opposite to arm being used to reach.  [place] close to area to be raked. Use arm movements to do the work. Keep back straight and avoid  twisting.     Cart  When reaching into cart with one arm, lift opposite leg to keep back straight.   Getting Into / Out of Bed  Lower self to lie down on one side by raising legs and lowering head at the same time. Use arms to assist moving without twisting. Bend both knees to roll onto back if desired. To sit up, start from lying on side, and use same move-ments in reverse. Housework - Vacuuming  Hold the vacuum with arm held at side. Step back and forth to move it, keeping head up. Avoid twisting.   Laundry - IT consultant so that bending and twisting can be avoided.   Laundry - Unloading Dryer  Squat down to reach into clothes dryer or use a reacher.  Gardening - Weeding / Probation officer or Kneel. Knee pads may be helpful.                  Extensors, Sitting / Standing   Stand or sit, head in comfortable, centered position. Gently tuck chin and bring toward chest. Hold _10__ seconds. Repeat 3 to 5 ___ times per session. Do PRN___ sessions per day.  Copyright  VHI. All rights reserved.  Adduction (Active)   Maintaining erect posture, draw shoulders back while bringing elbows back and inward. Repeat 5____ times. Do PRN____ sessions per day.  Copyright  VHI. All rights reserved.

## 2015-01-17 ENCOUNTER — Ambulatory Visit (HOSPITAL_COMMUNITY)
Admission: RE | Admit: 2015-01-17 | Discharge: 2015-01-17 | Disposition: A | Discharge status: Home / Self Care | Payer: 59 | Source: Ambulatory Visit | Attending: General Surgery | Admitting: General Surgery

## 2015-01-17 ENCOUNTER — Other Ambulatory Visit (HOSPITAL_COMMUNITY): Payer: 59

## 2015-01-17 ENCOUNTER — Ambulatory Visit (HOSPITAL_COMMUNITY): Payer: 59

## 2015-01-17 ENCOUNTER — Other Ambulatory Visit: Payer: Self-pay

## 2015-01-17 DIAGNOSIS — J45909 Unspecified asthma, uncomplicated: Secondary | ICD-10-CM | POA: Diagnosis not present

## 2015-01-17 DIAGNOSIS — E780 Pure hypercholesterolemia: Secondary | ICD-10-CM | POA: Diagnosis not present

## 2015-01-17 DIAGNOSIS — Z90710 Acquired absence of both cervix and uterus: Secondary | ICD-10-CM | POA: Insufficient documentation

## 2015-01-17 DIAGNOSIS — Z6835 Body mass index (BMI) 35.0-35.9, adult: Secondary | ICD-10-CM | POA: Diagnosis not present

## 2015-01-19 ENCOUNTER — Ambulatory Visit: Payer: 59 | Admitting: Physical Therapy

## 2015-01-24 ENCOUNTER — Ambulatory Visit: Payer: 59 | Admitting: Physical Therapy

## 2015-01-24 DIAGNOSIS — M436 Torticollis: Secondary | ICD-10-CM

## 2015-01-24 DIAGNOSIS — M792 Neuralgia and neuritis, unspecified: Secondary | ICD-10-CM

## 2015-01-24 DIAGNOSIS — M542 Cervicalgia: Secondary | ICD-10-CM | POA: Diagnosis not present

## 2015-01-24 NOTE — Patient Instructions (Signed)
Over Head Pull: Narrow Grip       On back, knees bent, feet flat, band across thighs, elbows straight but relaxed. Pull hands apart (start). Keeping elbows straight, bring arms up and over head, hands toward floor. Keep pull steady on band. Hold momentarily. Return slowly, keeping pull steady, back to start. Repeat10__ times. Band color _red_____   Side Pull: Double Arm   On back, knees bent, feet flat. Arms perpendicular to body, shoulder level, elbows straight but relaxed. Pull arms out to sides, elbows straight. Resistance band comes across collarbones, hands toward floor. Hold momentarily. Slowly return to starting position. Repeat _10__ times. Band color _red____   Elmer Picker   On back, knees bent, feet flat, left hand on left hip, right hand above left. Pull right arm DIAGONALLY (hip to shoulder) across chest. Bring right arm along head toward floor. Hold momentarily. Slowly return to starting position. Repeat 10___ times. Do with left arm. Band color _red_____   Shoulder Rotation: Double Arm   On back, knees bent, feet flat, elbows tucked at sides, bent 90, hands palms up. Pull hands apart and down toward floor, keeping elbows near sides. Hold momentarily. Slowly return to starting position. Repeat 10___ times. Band color _RED_____
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
## 2015-01-27 ENCOUNTER — Ambulatory Visit: Payer: 59 | Admitting: Physical Therapy

## 2015-01-27 DIAGNOSIS — M542 Cervicalgia: Secondary | ICD-10-CM

## 2015-01-27 DIAGNOSIS — M436 Torticollis: Secondary | ICD-10-CM

## 2015-01-27 DIAGNOSIS — M792 Neuralgia and neuritis, unspecified: Secondary | ICD-10-CM
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
## 2015-02-01 ENCOUNTER — Ambulatory Visit: Payer: 59 | Admitting: Physical Therapy

## 2015-02-01 DIAGNOSIS — M436 Torticollis: Secondary | ICD-10-CM

## 2015-02-01 DIAGNOSIS — M792 Neuralgia and neuritis, unspecified: Secondary | ICD-10-CM

## 2015-02-01 DIAGNOSIS — M542 Cervicalgia: Secondary | ICD-10-CM

## 2015-02-01 NOTE — Patient Instructions (Signed)
Median nerve glides with elbow bent 3x/day 5 reps
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
## 2015-02-03 ENCOUNTER — Ambulatory Visit: Payer: 59 | Admitting: Physical Therapy

## 2015-02-08 ENCOUNTER — Ambulatory Visit: Payer: 59 | Admitting: Physical Therapy

## 2015-02-10 ENCOUNTER — Ambulatory Visit: Payer: 59 | Attending: Sports Medicine | Admitting: Physical Therapy

## 2015-02-15 ENCOUNTER — Encounter: Payer: 59 | Attending: General Surgery | Admitting: Dietician

## 2015-02-15 VITALS — Ht 72.0 in | Wt 264.9 lb

## 2015-02-15 DIAGNOSIS — E669 Obesity, unspecified: Secondary | ICD-10-CM | POA: Insufficient documentation

## 2015-02-15 DIAGNOSIS — Z6835 Body mass index (BMI) 35.0-35.9, adult: Secondary | ICD-10-CM | POA: Diagnosis not present

## 2015-02-15 DIAGNOSIS — Z713 Dietary counseling and surveillance: Secondary | ICD-10-CM | POA: Insufficient documentation

## 2015-02-15 NOTE — Patient Instructions (Signed)

## 2015-02-15 NOTE — Progress Notes (Signed)
  Pre-Op Assessment Visit:  Pre-Operative Sleeve Gastrectomy Surgery  Medical Nutrition Therapy:  Appt start time: 6659   End time:  0840.  Patient was seen on 02/15/2015 for Pre-Operative Nutrition Assessment. Assessment and letter of approval faxed to [facility] Surgery Bariatric Surgery Program coordinator on 02/15/2015.   Preferred Learning Style:   No preference indicated   Learning Readiness:   Ready  Handouts given during visit include:  Pre-Op Goals Bariatric Surgery Protein Shakes   During the appointment today the following Pre-Op Goals were reviewed with the patient: Maintain or lose weight as instructed by your surgeon Make healthy food choices Begin to limit portion sizes Limited concentrated sugars and fried foods Keep fat/sugar in the single digits per serving on   food labels Practice CHEWING your food  (aim for 30 chews per bite or until applesauce consistency) Practice not drinking 15 minutes before, during, and 30 minutes after each meal/snack Avoid all carbonated beverages  Avoid/limit caffeinated beverages  Avoid all sugar-sweetened beverages Consume 3 meals per day; eat every 3-5 hours Make a list of non-food related activities Aim for 64-100 ounces of FLUID daily  Aim for at least 60-80 grams of PROTEIN daily Look for a liquid protein source that contain ?15 g protein and ?5 g carbohydrate  (ex: shakes, drinks, shots)  Patient-Centered Goals: Goals: no longer take cholesterol medication, increase activity, improve knee pain  10 level of importance/8 level of confidence  Demonstrated degree of understanding via:  Teach Back  Teaching Method Utilized:  Visual Auditory Hands on  Barriers to learning/adherence to lifestyle change: none  Patient to call the [facility] to enroll in Pre-Op and Post-Op Nutrition Education when surgery date is scheduled.

## 2015-07-04 ENCOUNTER — Encounter: Payer: 59 | Attending: Internal Medicine

## 2015-07-04 DIAGNOSIS — Z713 Dietary counseling and surveillance: Secondary | ICD-10-CM | POA: Insufficient documentation

## 2015-07-04 DIAGNOSIS — Z6836 Body mass index (BMI) 36.0-36.9, adult: Secondary | ICD-10-CM | POA: Insufficient documentation

## 2015-07-05 NOTE — Progress Notes (Signed)
  Pre-Operative Nutrition Class:  Appt start time: 8088   End time:  1830.  Patient was seen on 07/04/15 for Pre-Operative Bariatric Surgery Education at the [facility].   Surgery date: 07/26/2015 Surgery type: Sleeve gastrectomy Start weight at [facility]: 265 lbs on 02/15/15 Weight today: 272 lbs  TANITA  BODY COMP RESULTS  07/04/15   BMI (kg/m^2) 36.9   Fat Mass (lbs) 141   Fat Free Mass (lbs) 131   Total Body Water (lbs) 96   Samples given per MNT protocol. Patient educated on appropriate usage: Premier protein shake (strawberry - qty 1) Lot #: [id number] Exp: 04/2016  Unjury protein powder (chicken soup - qty 1) Lot #: [id number] Exp: 06/2016  Celebrate Vitamins Multivitamin chew (berry - qty 1) Lot #: [id number] Exp: 10/2016  PB2 (qty 1) Lot #: [id number] Exp: 05/2017  The following the learning objectives were met by the patient during this course:  Identify Pre-Op Dietary Goals and will begin 2 weeks pre-operatively  Identify appropriate sources of fluids and proteins   State protein recommendations and appropriate sources pre and post-operatively  Identify Post-Operative Dietary Goals and will follow for 2 weeks post-operatively  Identify appropriate multivitamin and calcium sources  Describe the need for physical activity post-operatively and will follow MD recommendations  State when to call healthcare provider regarding medication questions or post-operative complications  Handouts given during class include:  Pre-Op Bariatric Surgery Diet Handout  Protein Shake Handout  Post-Op Bariatric Surgery Nutrition Handout  [facility] Program Information Flyer  Support Group Information Flyer  [facility] Bariatric Supplements Price List  Follow-Up Plan: Patient will follow-up at [facility] 2 weeks post operatively for diet advancement per MD.

## 2015-07-20 ENCOUNTER — Other Ambulatory Visit (HOSPITAL_COMMUNITY): Payer: 59

## 2015-07-26 ENCOUNTER — Encounter (HOSPITAL_COMMUNITY): Admission: RE | Payer: Self-pay | Source: Ambulatory Visit

## 2015-07-26 ENCOUNTER — Inpatient Hospital Stay (HOSPITAL_COMMUNITY): Admission: RE | Admit: 2015-07-26 | Payer: 59 | Source: Ambulatory Visit | Admitting: General Surgery

## 2015-07-26 SURGERY — GASTRECTOMY, SLEEVE, LAPAROSCOPIC
Anesthesia: General

## 2015-08-09 ENCOUNTER — Ambulatory Visit: Payer: 59

## 2015-08-15 DIAGNOSIS — M545 Low back pain: Secondary | ICD-10-CM | POA: Diagnosis not present

## 2015-08-15 DIAGNOSIS — M47816 Spondylosis without myelopathy or radiculopathy, lumbar region: Secondary | ICD-10-CM | POA: Diagnosis not present

## 2015-08-15 DIAGNOSIS — M542 Cervicalgia: Secondary | ICD-10-CM | POA: Diagnosis not present

## 2015-08-15 DIAGNOSIS — R109 Unspecified abdominal pain: Secondary | ICD-10-CM | POA: Diagnosis not present

## 2015-08-15 DIAGNOSIS — R319 Hematuria, unspecified: Secondary | ICD-10-CM | POA: Diagnosis not present

## 2015-08-15 DIAGNOSIS — M47812 Spondylosis without myelopathy or radiculopathy, cervical region: Secondary | ICD-10-CM | POA: Diagnosis not present

## 2015-08-16 MED FILL — traMADol HCL 50 MG TABS: 50 | 13 days supply | Qty: 40 | Fill #0

## 2015-08-23 DIAGNOSIS — G5792 Unspecified mononeuropathy of left lower limb: Secondary | ICD-10-CM | POA: Diagnosis not present

## 2015-08-23 DIAGNOSIS — M545 Low back pain: Secondary | ICD-10-CM | POA: Diagnosis not present

## 2015-08-23 MED FILL — predniSONE 5 MG (21) TBPK: 5 | 6 days supply | Qty: 21 | Fill #0

## 2015-09-01 MED FILL — ZALEPLON 10 MG CAPSULE: 10 | 30 days supply | Qty: 30 | Fill #3

## 2015-09-26 ENCOUNTER — Ambulatory Visit: Payer: Self-pay | Admitting: General Surgery

## 2015-09-26 DIAGNOSIS — H8110 Benign paroxysmal vertigo, unspecified ear: Secondary | ICD-10-CM | POA: Diagnosis not present

## 2015-09-26 DIAGNOSIS — E669 Obesity, unspecified: Secondary | ICD-10-CM | POA: Diagnosis not present

## 2015-09-26 DIAGNOSIS — H6980 Other specified disorders of Eustachian tube, unspecified ear: Secondary | ICD-10-CM | POA: Diagnosis not present

## 2015-09-26 DIAGNOSIS — J302 Other seasonal allergic rhinitis: Secondary | ICD-10-CM | POA: Diagnosis not present

## 2015-09-26 DIAGNOSIS — Z6836 Body mass index (BMI) 36.0-36.9, adult: Secondary | ICD-10-CM | POA: Diagnosis not present

## 2015-09-26 DIAGNOSIS — R030 Elevated blood-pressure reading, without diagnosis of hypertension: Secondary | ICD-10-CM | POA: Diagnosis not present

## 2015-09-26 DIAGNOSIS — H9319 Tinnitus, unspecified ear: Secondary | ICD-10-CM | POA: Diagnosis not present

## 2015-09-26 MED FILL — MECLIZINE 25 MG TABLET: 25 | 7 days supply | Qty: 30 | Fill #0

## 2015-10-06 ENCOUNTER — Ambulatory Visit: Payer: Self-pay | Admitting: General Surgery

## 2015-10-06 DIAGNOSIS — R7303 Prediabetes: Secondary | ICD-10-CM | POA: Diagnosis not present

## 2015-10-06 DIAGNOSIS — E669 Obesity, unspecified: Secondary | ICD-10-CM | POA: Diagnosis not present

## 2015-10-06 DIAGNOSIS — E7800 Pure hypercholesterolemia, unspecified: Secondary | ICD-10-CM | POA: Diagnosis not present

## 2015-10-06 DIAGNOSIS — M170 Bilateral primary osteoarthritis of knee: Secondary | ICD-10-CM | POA: Diagnosis not present

## 2015-10-06 MED FILL — oxyCODONE HCL 5 MG/5ML SOLN: 5 | 3 days supply | Qty: 200 | Fill #0

## 2015-10-06 MED FILL — ZALEPLON 10 MG CAPSULE: 10 | 90 days supply | Qty: 90 | Fill #0

## 2015-10-06 MED FILL — ONDANSETRON ODT 4 MG TABLET: 4 | 3 days supply | Qty: 20 | Fill #0

## 2015-10-06 MED FILL — PANTOPRAZOLE SOD DR 40 MG T: 40 | 30 days supply | Qty: 30 | Fill #0

## 2015-10-06 NOTE — H&P (Signed)
Terry Salazar 10/06/2015 4:20 PM Location: [facility] Patient #: [id number] DOB: 1961-01-30 Divorced / Language: English / Race: Black or African American Female  History of Present Illness Randall Hiss M. Rayen Dafoe MD; 10/06/2015 5:18 PM) The patient is a 55 year old female who presents for a preoperative evaluation. She comes in today for her preoperative appointment. She has been approved and scheduled for laparoscopic sleeve gastrectomy. Her surgery date is March 14. She was initially scheduled for surgery in December 2016 back in December but had to cancel her surgery due to a family member needing emergent surgery. she had some outpatient rehabilitation for right arm nerve pain. Otherwise she denies any medical changes. She denies any chest pain, chest pressure, source of breath, dyspnea on exertion, TIAs or amaurosis fugax. She denies any heartburn or reflux. She denies any abdominal pain. She denies any diarrhea or constipation 07/07/2015 She was initally scheduled for today for a preop appointment for her laparoscopic sleeve gastrectomy for which she has been approved. However after arriving today she tells [place] that she is going to have to postpone surgery since her son has to be scheduled for unplanned orthopedic surgery. She denies any changes since she was seen. She denies any medical changes. Her abdominal u/s showed a fatty liver. Her UGI showed no hiatal hernia. Her evaluation blood work was done earlier in the year. Her A1C was 6. H pylori was negative. CMP was normal. Total cholesterol 202, LDL 110. Hgb was 11.8; HCT was 34.9.   Problem List/Past Medical Randall Hiss Ronnie Derby, MD;  10/06/2015 5:18 PM) OSTEOARTHRITIS OF BOTH KNEES, UNSPECIFIED OSTEOARTHRITIS TYPE (M17.0) OBESITY (BMI 30-39.9) (E66.9) PREDIABETES (R73.03) ELEVATED LDL CHOLESTEROL LEVEL (E78.00)  Other Problems Gayland Curry, MD; 10/06/2015 5:18 PM) Arthritis Asthma Back Pain Hemorrhoids Hypercholesterolemia  Past Surgical History Gayland Curry, MD; 10/06/2015 5:18 PM) Hysterectomy (not due to cancer) - Partial Oral Surgery  Diagnostic Studies History Gayland Curry, MD; 10/06/2015 5:18 PM) Colonoscopy 1-5 years ago Mammogram 1-3 years ago Pap Smear 1-5 years ago  Allergies Elbert Ewings, CMA; 10/06/2015 4:21 PM) Thimerosal *ANTISEPTICS & DISINFECTANTS*  Medication History Gayland Curry, MD; 10/06/2015 5:18 PM) Read Drivers (10MG  Capsule, Oral) Active. Benadryl Allergy (25MG  Capsule, Oral) Active. ZyrTEC Allergy (10MG  Capsule, Oral) Active. Meclizine HCl (25MG  Tablet, Oral) Active. Mobic (15MG  Tablet, Oral) Active. Medications Reconciled OxyCODONE HCl (5MG /5ML Solution, 5-10 Oral every four hours, as needed, Taken starting 10/06/2015) Active. Zofran ODT (4MG  Tablet Disperse, 1 (one) Oral every four hours, as needed, Taken starting 10/06/2015) Active. Protonix (40MG  Tablet DR, 1 (one) Oral daily, Taken starting 10/06/2015) Active. B Complex + C TR (Oral) Active. Lipitor (10MG  Tablet, Oral) Active. Valtrex (500MG  Tablet, Oral) Active.  Social History Gayland Curry, MD; 10/06/2015 5:18 PM) Alcohol use Moderate alcohol use. Caffeine use Carbonated beverages, Tea. Illicit drug use Remotely quit drug use. Tobacco use Never smoker.  Family History Gayland Curry, MD; 10/06/2015 5:18 PM) Prostate Cancer Family Members In General. Heart Disease Father. Diabetes Mellitus Father, Mother. Hypertension Family Members In General, Father. Heart disease in female family member before age 51 Cancer Family Members In General. Cerebrovascular Accident Family Members In  [place] Members In General. Colon Polyps Father, Mother. Breast Cancer Family Members In General. Alcohol Abuse Father. Bleeding disorder Mother. Arthritis Mother.  Pregnancy / Birth History Gayland Curry, MD; 10/06/2015 5:18 PM) Age at menarche 5 years. Gravida 3 Maternal age 65-25 Para 1     Review of Systems (  Leighton Ruff. Jhoel Stieg MD; 10/06/2015 5:18 PM) General Present- Weight Gain. Not Present- Appetite Loss, Chills, Fatigue, Fever, Night Sweats and Weight Loss. Skin Present- Dryness. Not Present- Change in Wart/Mole, Hives, Jaundice, New Lesions, Non-Healing Wounds, Rash and Ulcer. HEENT Present- Hearing Loss and Ringing in the Ears. Not Present- Earache, Hoarseness, Nose Bleed, Oral Ulcers, Seasonal Allergies, Sinus Pain, Sore Throat, Visual Disturbances, Wears glasses/contact lenses and Yellow Eyes. Respiratory Present- Snoring. Not Present- Bloody sputum, Chronic Cough, Difficulty Breathing and Wheezing. Breast Not Present- Breast Mass, Breast Pain, Nipple Discharge and Skin Changes. Cardiovascular Not Present- Chest Pain, Difficulty Breathing Lying Down, Leg Cramps, Palpitations, Rapid Heart Rate, Shortness of Breath and Swelling of Extremities. Gastrointestinal Present- Hemorrhoids. Not Present- Abdominal Pain, Bloating, Bloody Stool, Change in Bowel Habits, Chronic diarrhea, Constipation, Difficulty Swallowing, Excessive gas, Gets full quickly at meals, Indigestion, Nausea, Rectal Pain and Vomiting. Female Genitourinary Not Present- Frequency, Nocturia, Painful Urination, Pelvic Pain and Urgency. Musculoskeletal Present- Joint Pain and Swelling of Extremities. Not Present- Back Pain, Joint Stiffness, Muscle Pain and Muscle Weakness. Neurological Present- Numbness. Not Present- Decreased Memory, Fainting, Headaches, Seizures, Tingling, Tremor, Trouble walking and Weakness. Psychiatric Not Present- Anxiety, Bipolar, Change in Sleep Pattern, Depression,  Fearful and Frequent crying. Endocrine Not Present- Cold Intolerance, Excessive Hunger, Hair Changes, Heat Intolerance, Hot flashes and New Diabetes. Hematology Not Present- Easy Bruising, Excessive bleeding, Gland problems, HIV and Persistent Infections.  Vitals Elbert Ewings CMA; 10/06/2015 4:21 PM) 10/06/2015 4:21 PM Weight: 273.6 lb Height: 72in Body Surface Area: 2.43 m Body Mass Index: 37.11 kg/m  Temp.: 97.32F  Pulse: 80 (Regular)  BP: 138/78 (Sitting, Left Arm, Standard)      Physical Exam Randall Hiss M. Avyonna Wagoner MD; 10/06/2015 5:11 PM)  General Mental Status-Alert. General Appearance-Consistent with stated age. Hydration-Well hydrated. Voice-Normal. Note: obesity  Head and Neck Head-normocephalic, atraumatic with no lesions or palpable masses. Trachea-midline. Thyroid Gland Characteristics - normal size and consistency.  Eye Eyeball - Bilateral-Extraocular movements intact. Sclera/Conjunctiva - Bilateral-No scleral icterus.  Chest and Lung Exam Chest and lung exam reveals -quiet, even and easy respiratory effort with no use of accessory muscles and on auscultation, normal breath sounds, no adventitious sounds and normal vocal resonance. Inspection Chest Wall - Normal. Back - normal.  Breast - Did not examine.  Cardiovascular Cardiovascular examination reveals -normal heart sounds, regular rate and rhythm with no murmurs and normal pedal pulses bilaterally.  Abdomen Inspection Inspection of the abdomen reveals - No Hernias. Skin - Scar - no surgical scars. Palpation/Percussion Palpation and Percussion of the abdomen reveal - Soft, Non Tender, No Rebound tenderness, No Rigidity (guarding) and No hepatosplenomegaly. Auscultation Auscultation of the abdomen reveals - Bowel sounds normal.  Peripheral Vascular Upper Extremity Palpation - Pulses bilaterally normal.  Neurologic Neurologic evaluation reveals -alert and oriented x 3 with  no impairment of recent or remote memory. Mental Status-Normal.  Neuropsychiatric The patient's mood and affect are described as -normal. Judgment and Insight-insight is appropriate concerning matters relevant to self.  Musculoskeletal Normal Exam - Left-Upper Extremity Strength Normal and Lower Extremity Strength Normal. Normal Exam - Right-Upper Extremity Strength Normal and Lower Extremity Strength Normal. Note: BILATERAL KNEE CREPITUS  Lymphatic Head & Neck  General Head & Neck Lymphatics: Bilateral - Description - Normal. Axillary - Did not examine. Femoral & Inguinal - Did not examine.    Assessment & Plan Randall Hiss M. Rheya Minogue MD; 10/06/2015 5:10 PM)  OBESITY (BMI 30-39.9) (E66.9) Impression: We reviewed her preoperative workup discussing her UGI, ultrasound, and labs.  Her PCP addressed the Hgb A1C. We rediscussed the typical operative course and postop course. All of her questions were asked and answered. At this point I went ahead and gave her postop pain, nausea, reflux prescriptions.  Current Plans Pt Education - EMW_preopbariatric Continued OxyCODONE HCl 5MG /5ML, 5-10 Milliliter every four hours, as needed, 200 Milliliter, 10/06/2015, No Refill. Continued Zofran ODT 4MG , 1 (one) Tablet Disperse every four hours, as needed, #20, 10/06/2015, No Refill. Continued Protonix 40MG , 1 (one) Tablet daily, #30, 10/06/2015, Ref. x1. PREDIABETES (R73.03)  ELEVATED LDL CHOLESTEROL LEVEL (E78.00) Impression: Per PCP  OSTEOARTHRITIS OF BOTH KNEES, UNSPECIFIED OSTEOARTHRITIS TYPE (M17.0) Impression: Per orthopedic surgeon  Leighton Ruff. Redmond Pulling, MD, FACS General, Bariatric, & Minimally Invasive Surgery [facility], [place]

## 2015-10-12 NOTE — Patient Instructions (Addendum)
Terry Salazar  10/12/2015   Your procedure is scheduled on: 10-18-15  Report to [facility] Main  Entrance take [facility]  elevators to 3rd floor to  [place] at 515  AM.  Call this number if you have problems the morning of surgery [phone]   Remember: ONLY 1 PERSON MAY GO WITH YOU TO SHORT STAY TO GET  READY MORNING OF [place].  Do not eat food or drink liquids :After Midnight.     Take these medicines the morning of surgery with A SIP OF WATER:Atorvastatin (Lipitor) eye drop if needed               You may not have any metal on your body including hair pins and              piercings  Do not wear jewelry, make-up, lotions, powders or perfumes, deodorant             Do not wear nail polish.  Do not shave  48 hours prior to surgery.              Men may shave face and neck.   Do not bring valuables to the hospital. [place].  Contacts, dentures or bridgework may not be worn into surgery.  Leave suitcase in the car. After surgery it may be brought to your room.                Please read over the following fact sheets you were given: _____________________________________________________________________             [facility] - Preparing for Surgery Before surgery, you can play an important role.  Because skin is not sterile, your skin needs to be as free of germs as possible.  You can reduce the number of germs on your skin by washing with CHG (chlorahexidine gluconate) soap before surgery.  CHG is an antiseptic cleaner which kills germs and bonds with the skin to continue killing germs even after washing. Please DO NOT use if you have an allergy to CHG or antibacterial soaps.  If your skin becomes reddened/irritated stop using the CHG and inform your nurse when you arrive at Short Stay. Do not shave (including legs and underarms) for at least 48 hours prior to the first CHG shower.  You may shave  your face/neck. Please follow these instructions carefully:  1.  Shower with CHG Soap the night before surgery and the  morning of Surgery.  2.  If you choose to wash your hair, wash your hair first as usual with your  normal  shampoo.  3.  After you shampoo, rinse your hair and body thoroughly to remove the  shampoo.                           4.  Use CHG as you would any other liquid soap.  You can apply chg directly  to the skin and wash                       Gently with a scrungie or clean washcloth.  5.  Apply the CHG Soap to your body ONLY FROM THE NECK DOWN.   Do not use on  face/ open                           Wound or open sores. Avoid contact with eyes, ears mouth and genitals (private parts).                       Wash face,  Genitals (private parts) with your normal soap.             6.  Wash thoroughly, paying special attention to the area where your surgery  will be performed.  7.  Thoroughly rinse your body with warm water from the neck down.  8.  DO NOT shower/wash with your normal soap after using and rinsing off  the CHG Soap.                9.  Pat yourself dry with a clean towel.            10.  Wear clean pajamas.            11.  Place clean sheets on your bed the night of your first shower and do not  sleep with pets. Day of Surgery : Do not apply any lotions/deodorants the morning of surgery.  Please wear clean clothes to the hospital/surgery center.  FAILURE TO FOLLOW THESE INSTRUCTIONS MAY RESULT IN THE CANCELLATION OF YOUR SURGERY PATIENT SIGNATURE_________________________________  NURSE SIGNATURE__________________________________  ________________________________________________________________________

## 2015-10-12 NOTE — Progress Notes (Addendum)
ekg 01-17-15 epic

## 2015-10-13 ENCOUNTER — Inpatient Hospital Stay (HOSPITAL_COMMUNITY)
Admission: RE | Admit: 2015-10-13 | Discharge: 2015-10-13 | Disposition: A | Discharge status: Home / Self Care | Payer: 59 | Source: Ambulatory Visit

## 2015-10-13 NOTE — Patient Instructions (Addendum)
YOUR PROCEDURE IS SCHEDULED ON : 10/18/15  REPORT TO [place] MAIN ENTRANCE FOLLOW SIGNS TO EAST ELEVATOR - GO TO 3rd FLOOR CHECK IN AT 3 EAST NURSES STATION (SHORT STAY) AT: 5:15 AM  CALL THIS NUMBER IF YOU HAVE PROBLEMS THE MORNING OF SURGERY [phone]  REMEMBER:ONLY 1 PER PERSON MAY GO TO SHORT STAY WITH YOU TO GET READY THE MORNING OF YOUR SURGERY  DO NOT EAT FOOD OR DRINK LIQUIDS AFTER MIDNIGHT  STOP ASPIRIN / IBUPROFEN / ALEVE / VITAMINS / HERBAL MEDS __7__ DAYS BEFORE SURGERY  TAKE THESE MEDICINES THE MORNING OF SURGERY: NONE  YOU MAY NOT HAVE ANY METAL ON YOUR BODY INCLUDING HAIR PINS AND PIERCING'S. DO NOT WEAR JEWELRY, MAKEUP, LOTIONS, POWDERS OR PERFUMES. DO NOT WEAR NAIL POLISH. DO NOT SHAVE 48 HRS PRIOR TO SURGERY. MEN MAY SHAVE FACE AND NECK.  DO NOT [place]. [facility] IS NOT RESPONSIBLE FOR VALUABLES.  CONTACTS, DENTURES OR PARTIALS MAY NOT BE WORN TO SURGERY. LEAVE SUITCASE IN CAR. CAN BE BROUGHT TO ROOM AFTER SURGERY.  PATIENTS DISCHARGED THE DAY OF SURGERY WILL NOT BE ALLOWED TO DRIVE HOME.  PLEASE READ OVER THE FOLLOWING INSTRUCTION SHEETS _________________________________________________________________________________                                          [place] - PREPARING FOR SURGERY  Before surgery, you can play an important role.  Because skin is not sterile, your skin needs to be as free of germs as possible.  You can reduce the number of germs on your skin by washing with CHG (chlorahexidine gluconate) soap before surgery.  CHG is an antiseptic cleaner which kills germs and bonds with the skin to continue killing germs even after washing. Please DO NOT use if you have an allergy to CHG or antibacterial soaps.  If your skin becomes reddened/irritated stop using the CHG and inform your nurse when you arrive at Short Stay. Do not shave (including legs and underarms) for at least 48 hours prior to the first  CHG shower.  You may shave your face. Please follow these instructions carefully:   1.  Shower with CHG Soap the night before surgery and the  morning of Surgery.   2.  If you choose to wash your hair, wash your hair first as usual with your  normal  Shampoo.   3.  After you shampoo, rinse your hair and body thoroughly to remove the  shampoo.                                         4.  Use CHG as you would any other liquid soap.  You can apply chg directly  to the skin and wash . Gently wash with scrungie or clean wascloth    5.  Apply the CHG Soap to your body ONLY FROM THE NECK DOWN.   Do not use on open                           Wound or open sores. Avoid contact with eyes, ears mouth and genitals (private parts).  Genitals (private parts) with your normal soap.              6.  Wash thoroughly, paying special attention to the area where your surgery  will be performed.   7.  Thoroughly rinse your body with warm water from the neck down.   8.  DO NOT shower/wash with your normal soap after using and rinsing off  the CHG Soap .                9.  Pat yourself dry with a clean towel.             10.  Wear clean night clothes to bed after shower             11.  Place clean sheets on your bed the night of your first shower and do not  sleep with pets.  Day of Surgery : Do not apply any lotions/deodorants the morning of surgery.  Please wear clean clothes to the hospital/surgery center.  FAILURE TO FOLLOW THESE INSTRUCTIONS MAY RESULT IN THE CANCELLATION OF YOUR SURGERY    PATIENT SIGNATURE_________________________________  ______________________________________________________________________

## 2015-10-14 ENCOUNTER — Encounter (HOSPITAL_COMMUNITY)
Admission: RE | Admit: 2015-10-14 | Discharge: 2015-10-14 | Disposition: A | Discharge status: Home / Self Care | Payer: 59 | Source: Ambulatory Visit | Attending: General Surgery | Admitting: General Surgery

## 2015-10-14 ENCOUNTER — Encounter (HOSPITAL_COMMUNITY): Payer: Self-pay

## 2015-10-14 DIAGNOSIS — Z01812 Encounter for preprocedural laboratory examination: Secondary | ICD-10-CM | POA: Diagnosis not present

## 2015-10-14 DIAGNOSIS — Z6836 Body mass index (BMI) 36.0-36.9, adult: Secondary | ICD-10-CM | POA: Insufficient documentation

## 2015-10-14 HISTORY — DX: Dermatitis, unspecified: L30.9

## 2015-10-14 HISTORY — DX: Unspecified osteoarthritis, unspecified site: M19.90

## 2015-10-14 LAB — CBC WITH DIFFERENTIAL/PLATELET
BASOS ABS: 0 10*3/uL (ref 0.0–0.1)
Basophils Relative: 1 %
Eosinophils Absolute: 0.2 10*3/uL (ref 0.0–0.7)
Eosinophils Relative: 6 %
HEMATOCRIT: 34.4 % — AB (ref 36.0–46.0)
Hemoglobin: 11.4 g/dL — ABNORMAL LOW (ref 12.0–15.0)
LYMPHS PCT: 38 %
Lymphs Abs: 1.6 10*3/uL (ref 0.7–4.0)
MCH: 29.5 pg (ref 26.0–34.0)
MCHC: 33.1 g/dL (ref 30.0–36.0)
MCV: 89.1 fL (ref 78.0–100.0)
MONO ABS: 0.3 10*3/uL (ref 0.1–1.0)
Monocytes Relative: 7 %
Neutro Abs: 2 10*3/uL (ref 1.7–7.7)
Neutrophils Relative %: 48 %
Platelets: 281 10*3/uL (ref 150–400)
RBC: 3.86 MIL/uL — AB (ref 3.87–5.11)
RDW: 13.9 % (ref 11.5–15.5)
WBC: 4.2 10*3/uL (ref 4.0–10.5)

## 2015-10-14 LAB — COMPREHENSIVE METABOLIC PANEL
ALK PHOS: 80 U/L (ref 38–126)
ALT: 34 U/L (ref 14–54)
AST: 34 U/L (ref 15–41)
Albumin: 4.2 g/dL (ref 3.5–5.0)
Anion gap: 12 (ref 5–15)
BILIRUBIN TOTAL: 0.7 mg/dL (ref 0.3–1.2)
BUN: 15 mg/dL (ref 6–20)
CALCIUM: 9.4 mg/dL (ref 8.9–10.3)
CO2: 25 mmol/L (ref 22–32)
CREATININE: 0.93 mg/dL (ref 0.44–1.00)
Chloride: 107 mmol/L (ref 101–111)
GFR calc Af Amer: >60 mL/min (ref >60)
Glucose, Bld: 105 mg/dL — ABNORMAL HIGH (ref 65–99)
Potassium: 4.2 mmol/L (ref 3.5–5.1)
Sodium: 144 mmol/L (ref 135–145)
Total Protein: 8 g/dL (ref 6.5–8.1)

## 2015-10-17 NOTE — Anesthesia Preprocedure Evaluation (Addendum)
Anesthesia Evaluation  Patient identified by MRN, date of birth, ID band Patient awake    Reviewed: Allergy & Precautions, NPO status , Patient's Chart, lab work & pertinent test results  Airway Mallampati: I  TM Distance: >3 FB Neck ROM: Full    Dental  (+) Teeth Intact   Pulmonary asthma ,    breath sounds clear to auscultation       Cardiovascular negative cardio ROS   Rhythm:Regular Rate:Normal     Neuro/Psych negative neurological ROS  negative psych ROS   GI/Hepatic negative GI ROS, Neg liver ROS,   Endo/Other  negative endocrine ROS  Renal/GU negative Renal ROS  negative genitourinary   Musculoskeletal  (+) Arthritis , Osteoarthritis,    Abdominal   Peds negative pediatric ROS (+)  Hematology negative hematology ROS (+)   Anesthesia Other Findings - HLD  Reproductive/Obstetrics negative OB ROS                            Lab Results  Component Value Date   WBC 4.2 10/14/2015   HGB 11.4* 10/14/2015   HCT 34.4* 10/14/2015   MCV 89.1 10/14/2015   PLT 281 10/14/2015   Lab Results  Component Value Date   CREATININE 0.93 10/14/2015   BUN 15 10/14/2015   NA 144 10/14/2015   K 4.2 10/14/2015   CL 107 10/14/2015   CO2 25 10/14/2015   Lab Results  Component Value Date   INR 1.0 03/18/2008   01/2015 EKG: normal sinus rhythm.   Anesthesia Physical Anesthesia Plan  ASA: III  Anesthesia Plan: General   Post-op Pain Management:    Induction: Intravenous  Airway Management Planned: Oral ETT  Additional Equipment:   Intra-op Plan:   Post-operative Plan: Extubation in OR  Informed Consent: I have reviewed the patients History and Physical, chart, labs and discussed the procedure including the risks, benefits and alternatives for the proposed anesthesia with the patient or authorized representative who has indicated his/her understanding and acceptance.   Dental  advisory given  Plan Discussed with: CRNA  Anesthesia Plan Comments:         Anesthesia Quick Evaluation

## 2015-10-18 ENCOUNTER — Inpatient Hospital Stay (HOSPITAL_COMMUNITY): Payer: 59 | Admitting: Anesthesiology

## 2015-10-18 ENCOUNTER — Inpatient Hospital Stay (HOSPITAL_COMMUNITY)
Admission: RE | Admit: 2015-10-18 | Discharge: 2015-10-20 | DRG: 621 | Disposition: A | Discharge status: Home / Self Care | Payer: 59 | Source: Ambulatory Visit | Attending: General Surgery | Admitting: General Surgery

## 2015-10-18 ENCOUNTER — Encounter (HOSPITAL_COMMUNITY)
Admission: RE | Disposition: A | Discharge status: Home / Self Care | Payer: Self-pay | Source: Ambulatory Visit | Attending: General Surgery

## 2015-10-18 ENCOUNTER — Encounter (HOSPITAL_COMMUNITY): Payer: Self-pay | Admitting: Certified Registered Nurse Anesthetist

## 2015-10-18 DIAGNOSIS — R7303 Prediabetes: Secondary | ICD-10-CM | POA: Diagnosis not present

## 2015-10-18 DIAGNOSIS — E785 Hyperlipidemia, unspecified: Secondary | ICD-10-CM | POA: Diagnosis present

## 2015-10-18 DIAGNOSIS — Z8249 Family history of ischemic heart disease and other diseases of the circulatory system: Secondary | ICD-10-CM | POA: Diagnosis not present

## 2015-10-18 DIAGNOSIS — Z833 Family history of diabetes mellitus: Secondary | ICD-10-CM

## 2015-10-18 DIAGNOSIS — I10 Essential (primary) hypertension: Secondary | ICD-10-CM | POA: Diagnosis present

## 2015-10-18 DIAGNOSIS — M170 Bilateral primary osteoarthritis of knee: Secondary | ICD-10-CM | POA: Diagnosis present

## 2015-10-18 DIAGNOSIS — Z6837 Body mass index (BMI) 37.0-37.9, adult: Secondary | ICD-10-CM

## 2015-10-18 DIAGNOSIS — Z01812 Encounter for preprocedural laboratory examination: Secondary | ICD-10-CM

## 2015-10-18 DIAGNOSIS — J45909 Unspecified asthma, uncomplicated: Secondary | ICD-10-CM | POA: Diagnosis present

## 2015-10-18 DIAGNOSIS — K219 Gastro-esophageal reflux disease without esophagitis: Secondary | ICD-10-CM | POA: Diagnosis present

## 2015-10-18 DIAGNOSIS — Z79899 Other long term (current) drug therapy: Secondary | ICD-10-CM

## 2015-10-18 DIAGNOSIS — Z9884 Bariatric surgery status: Secondary | ICD-10-CM

## 2015-10-18 HISTORY — PX: LAPAROSCOPIC GASTRIC SLEEVE RESECTION: SHX5895

## 2015-10-18 LAB — GLUCOSE, CAPILLARY: GLUCOSE-CAPILLARY: 92 mg/dL (ref 65–99)

## 2015-10-18 LAB — HEMOGLOBIN AND HEMATOCRIT, BLOOD
HEMATOCRIT: 32.2 % — AB (ref 36.0–46.0)
HEMOGLOBIN: 10.5 g/dL — AB (ref 12.0–15.0)

## 2015-10-18 SURGERY — GASTRECTOMY, SLEEVE, LAPAROSCOPIC
Anesthesia: General | Site: Abdomen

## 2015-10-18 MED ORDER — LACTATED RINGERS IV SOLN
INTRAVENOUS | Status: DC | PRN
  Administered 2015-10-18 (×2): via INTRAVENOUS

## 2015-10-18 MED ORDER — MORPHINE SULFATE (PF) 2 MG/ML IV SOLN
2.0000 mg | INTRAVENOUS | Status: DC | PRN
  Administered 2015-10-18 (×2): 4 mg via INTRAVENOUS
  Administered 2015-10-18: 2 mg via INTRAVENOUS
  Administered 2015-10-19 (×5): 4 mg via INTRAVENOUS
  Filled 2015-10-18 (×4): qty 2
  Filled 2015-10-18: qty 1
  Filled 2015-10-18 (×3): qty 2

## 2015-10-18 MED ORDER — SUGAMMADEX SODIUM 500 MG/5ML IV SOLN
INTRAVENOUS | Status: DC | PRN
  Administered 2015-10-18: 200 mg via INTRAVENOUS

## 2015-10-18 MED ORDER — CEFOTETAN DISODIUM-DEXTROSE 2-2.08 GM-% IV SOLR
INTRAVENOUS | Status: AC
  Filled 2015-10-18: qty 50

## 2015-10-18 MED ORDER — MEPERIDINE HCL 50 MG/ML IJ SOLN: 6.2500 mg | INTRAMUSCULAR | Status: DC | PRN

## 2015-10-18 MED ORDER — PROPOFOL 10 MG/ML IV BOLUS
INTRAVENOUS | Status: DC | PRN
  Administered 2015-10-18: 180 mg via INTRAVENOUS

## 2015-10-18 MED ORDER — ONDANSETRON HCL 4 MG/2ML IJ SOLN
INTRAMUSCULAR | Status: AC
  Filled 2015-10-18: qty 2

## 2015-10-18 MED ORDER — LACTATED RINGERS IR SOLN
Status: DC | PRN
  Administered 2015-10-18: 1000 mL

## 2015-10-18 MED ORDER — FENTANYL CITRATE (PF) 100 MCG/2ML IJ SOLN
INTRAMUSCULAR | Status: DC | PRN
  Administered 2015-10-18 (×2): 25 ug via INTRAVENOUS
  Administered 2015-10-18 (×2): 50 ug via INTRAVENOUS

## 2015-10-18 MED ORDER — CEFOTETAN DISODIUM-DEXTROSE 2-2.08 GM-% IV SOLR
2.0000 g | INTRAVENOUS | Status: AC
  Administered 2015-10-18: 2 g via INTRAVENOUS

## 2015-10-18 MED ORDER — PANTOPRAZOLE SODIUM 40 MG IV SOLR
40.0000 mg | Freq: Every day | INTRAVENOUS | Status: DC
  Administered 2015-10-18 – 2015-10-19 (×2): 40 mg via INTRAVENOUS
  Filled 2015-10-18 (×3): qty 40

## 2015-10-18 MED ORDER — 0.9 % SODIUM CHLORIDE (POUR BTL) OPTIME
TOPICAL | Status: DC | PRN
  Administered 2015-10-18: 1000 mL

## 2015-10-18 MED ORDER — METHOCARBAMOL 1000 MG/10ML IJ SOLN
500.0000 mg | Freq: Three times a day (TID) | INTRAVENOUS | Status: DC
  Filled 2015-10-18 (×6): qty 5

## 2015-10-18 MED ORDER — EPHEDRINE SULFATE 50 MG/ML IJ SOLN
INTRAMUSCULAR | Status: AC
  Filled 2015-10-18: qty 1

## 2015-10-18 MED ORDER — FENTANYL CITRATE (PF) 250 MCG/5ML IJ SOLN
INTRAMUSCULAR | Status: AC
  Filled 2015-10-18: qty 5

## 2015-10-18 MED ORDER — HEPARIN SODIUM (PORCINE) 5000 UNIT/ML IJ SOLN
5000.0000 [IU] | INTRAMUSCULAR | Status: AC
  Administered 2015-10-18: 5000 [IU] via SUBCUTANEOUS
  Filled 2015-10-18: qty 1

## 2015-10-18 MED ORDER — ACETAMINOPHEN 160 MG/5ML PO SOLN: 325.0000 mg | ORAL | Status: DC | PRN

## 2015-10-18 MED ORDER — HYDROMORPHONE HCL 1 MG/ML IJ SOLN
0.2500 mg | INTRAMUSCULAR | Status: DC | PRN
  Administered 2015-10-18 (×2): 0.25 mg via INTRAVENOUS
  Administered 2015-10-18: 0.5 mg via INTRAVENOUS

## 2015-10-18 MED ORDER — ONDANSETRON HCL 4 MG/2ML IJ SOLN
4.0000 mg | INTRAMUSCULAR | Status: DC | PRN
  Administered 2015-10-18 – 2015-10-19 (×5): 4 mg via INTRAVENOUS
  Filled 2015-10-18 (×6): qty 2

## 2015-10-18 MED ORDER — BUPIVACAINE LIPOSOME 1.3 % IJ SUSP
20.0000 mL | Freq: Once | INTRAMUSCULAR | Status: AC
  Administered 2015-10-18: 20 mL
  Filled 2015-10-18: qty 20

## 2015-10-18 MED ORDER — PROMETHAZINE HCL 25 MG/ML IJ SOLN: 12.5000 mg | Freq: Four times a day (QID) | INTRAMUSCULAR | Status: DC | PRN

## 2015-10-18 MED ORDER — LIDOCAINE HCL (CARDIAC) 20 MG/ML IV SOLN
INTRAVENOUS | Status: DC | PRN
  Administered 2015-10-18: 75 mg via INTRAVENOUS

## 2015-10-18 MED ORDER — PROMETHAZINE HCL 25 MG/ML IJ SOLN
6.2500 mg | INTRAMUSCULAR | Status: DC | PRN
  Administered 2015-10-18: 6.25 mg via INTRAVENOUS

## 2015-10-18 MED ORDER — ACETAMINOPHEN 10 MG/ML IV SOLN: 1000.0000 mg | Freq: Four times a day (QID) | INTRAVENOUS | Status: DC

## 2015-10-18 MED ORDER — ENOXAPARIN SODIUM 30 MG/0.3ML ~~LOC~~ SOLN
30.0000 mg | Freq: Two times a day (BID) | SUBCUTANEOUS | Status: DC
  Administered 2015-10-19 – 2015-10-20 (×3): 30 mg via SUBCUTANEOUS
  Filled 2015-10-18 (×5): qty 0.3

## 2015-10-18 MED ORDER — ONDANSETRON HCL 4 MG/2ML IJ SOLN
INTRAMUSCULAR | Status: DC | PRN
  Administered 2015-10-18 (×2): 2 mg via INTRAVENOUS

## 2015-10-18 MED ORDER — MIDAZOLAM HCL 2 MG/2ML IJ SOLN
INTRAMUSCULAR | Status: AC
  Filled 2015-10-18: qty 2

## 2015-10-18 MED ORDER — PHENYLEPHRINE HCL 10 MG/ML IJ SOLN
INTRAMUSCULAR | Status: DC | PRN
  Administered 2015-10-18 (×3): 40 ug via INTRAVENOUS

## 2015-10-18 MED ORDER — ATROPINE SULFATE 0.4 MG/ML IJ SOLN
INTRAMUSCULAR | Status: AC
  Filled 2015-10-18: qty 1

## 2015-10-18 MED ORDER — OXYCODONE HCL 5 MG/5ML PO SOLN: 5.0000 mg | ORAL | Status: DC | PRN

## 2015-10-18 MED ORDER — PROMETHAZINE HCL 25 MG/ML IJ SOLN
INTRAMUSCULAR | Status: AC
  Filled 2015-10-18: qty 1

## 2015-10-18 MED ORDER — METHOCARBAMOL 1000 MG/10ML IJ SOLN
500.0000 mg | Freq: Once | INTRAMUSCULAR | Status: AC
  Administered 2015-10-18: 500 mg via INTRAVENOUS
  Filled 2015-10-18: qty 5

## 2015-10-18 MED ORDER — LACTATED RINGERS IV SOLN
INTRAVENOUS | Status: DC
  Administered 2015-10-18: 11:00:00 via INTRAVENOUS

## 2015-10-18 MED ORDER — CHLORHEXIDINE GLUCONATE 4 % EX LIQD: 60.0000 mL | Freq: Once | CUTANEOUS | Status: DC

## 2015-10-18 MED ORDER — ROCURONIUM BROMIDE 100 MG/10ML IV SOLN
INTRAVENOUS | Status: AC
  Filled 2015-10-18: qty 1

## 2015-10-18 MED ORDER — ACETAMINOPHEN 160 MG/5ML PO SOLN: 650.0000 mg | ORAL | Status: DC | PRN

## 2015-10-18 MED ORDER — PREMIER PROTEIN SHAKE
2.0000 [oz_av] | Freq: Four times a day (QID) | ORAL | Status: DC
  Administered 2015-10-20 (×4): 2 [oz_av] via ORAL

## 2015-10-18 MED ORDER — ROCURONIUM BROMIDE 100 MG/10ML IV SOLN
INTRAVENOUS | Status: DC | PRN
  Administered 2015-10-18: 40 mg via INTRAVENOUS
  Administered 2015-10-18: 20 mg via INTRAVENOUS
  Administered 2015-10-18: 5 mg via INTRAVENOUS

## 2015-10-18 MED ORDER — HYDROMORPHONE HCL 1 MG/ML IJ SOLN
INTRAMUSCULAR | Status: AC
  Filled 2015-10-18: qty 1

## 2015-10-18 MED ORDER — ENALAPRILAT 1.25 MG/ML IV SOLN
1.2500 mg | Freq: Three times a day (TID) | INTRAVENOUS | Status: DC | PRN
  Administered 2015-10-18 – 2015-10-19 (×2): 1.25 mg via INTRAVENOUS
  Filled 2015-10-18 (×3): qty 1

## 2015-10-18 MED ORDER — SODIUM CHLORIDE 0.9 % IJ SOLN
INTRAMUSCULAR | Status: DC | PRN
  Administered 2015-10-18: 50 mL

## 2015-10-18 MED ORDER — PROPOFOL 10 MG/ML IV BOLUS
INTRAVENOUS | Status: AC
  Filled 2015-10-18: qty 20

## 2015-10-18 MED ORDER — STERILE WATER FOR IRRIGATION IR SOLN
Status: DC | PRN
  Administered 2015-10-18: 1500 mL

## 2015-10-18 MED ORDER — SODIUM CHLORIDE 0.9 % IJ SOLN
INTRAMUSCULAR | Status: AC
  Filled 2015-10-18: qty 50

## 2015-10-18 MED ORDER — SUCCINYLCHOLINE CHLORIDE 20 MG/ML IJ SOLN
INTRAMUSCULAR | Status: DC | PRN
  Administered 2015-10-18: 120 mg via INTRAVENOUS

## 2015-10-18 MED ORDER — LIDOCAINE HCL (CARDIAC) 20 MG/ML IV SOLN
INTRAVENOUS | Status: AC
  Filled 2015-10-18: qty 5

## 2015-10-18 MED ORDER — DEXAMETHASONE SODIUM PHOSPHATE 10 MG/ML IJ SOLN
INTRAMUSCULAR | Status: AC
  Filled 2015-10-18: qty 1

## 2015-10-18 MED ORDER — SODIUM CHLORIDE 0.9 % IJ SOLN
INTRAMUSCULAR | Status: AC
  Filled 2015-10-18: qty 10

## 2015-10-18 MED ORDER — DEXAMETHASONE SODIUM PHOSPHATE 10 MG/ML IJ SOLN
INTRAMUSCULAR | Status: DC | PRN
  Administered 2015-10-18: 10 mg via INTRAVENOUS

## 2015-10-18 MED ORDER — KCL IN DEXTROSE-NACL 20-5-0.45 MEQ/L-%-% IV SOLN
INTRAVENOUS | Status: DC
  Administered 2015-10-18 – 2015-10-20 (×6): via INTRAVENOUS
  Filled 2015-10-18 (×7): qty 1000

## 2015-10-18 MED ORDER — MIDAZOLAM HCL 5 MG/5ML IJ SOLN
INTRAMUSCULAR | Status: DC | PRN
  Administered 2015-10-18 (×2): 1 mg via INTRAVENOUS

## 2015-10-18 SURGICAL SUPPLY — 68 items
ADH SKN CLS APL DERMABOND .7 (GAUZE/BANDAGES/DRESSINGS) ×1
APL SRG 32X5 SNPLK LF DISP (MISCELLANEOUS)
APPLICATOR COTTON TIP 6IN STRL (MISCELLANEOUS) IMPLANT
APPLIER CLIP ROT 10 11.4 M/L (STAPLE)
APR CLP MED LRG 11.4X10 (STAPLE)
BLADE SURG SZ11 CARB STEEL (BLADE) ×3 IMPLANT
CABLE HIGH FREQUENCY MONO STRZ (ELECTRODE) ×2 IMPLANT
CHLORAPREP W/TINT 26ML (MISCELLANEOUS) ×4 IMPLANT
CLIP APPLIE ROT 10 11.4 M/L (STAPLE) IMPLANT
COVER SURGICAL LIGHT HANDLE (MISCELLANEOUS) ×3 IMPLANT
DERMABOND ADVANCED (GAUZE/BANDAGES/DRESSINGS) ×2
DERMABOND ADVANCED .7 DNX12 (GAUZE/BANDAGES/DRESSINGS) ×1 IMPLANT
DEVICE SUT QUICK LOAD TK 5 (STAPLE) IMPLANT
DEVICE SUT TI-KNOT TK 5X26 (MISCELLANEOUS) IMPLANT
DEVICE SUTURE ENDOST 10MM (ENDOMECHANICALS) IMPLANT
DEVICE TI KNOT TK5 (MISCELLANEOUS)
DEVICE TROCAR PUNCTURE CLOSURE (ENDOMECHANICALS) ×2 IMPLANT
DRAPE UTILITY XL STRL (DRAPES) ×6 IMPLANT
ELECT REM PT RETURN 9FT ADLT (ELECTROSURGICAL) ×3
ELECTRODE REM PT RTRN 9FT ADLT (ELECTROSURGICAL) ×1 IMPLANT
GAUZE SPONGE 4X4 12PLY STRL (GAUZE/BANDAGES/DRESSINGS) IMPLANT
GLOVE BIO SURGEON STRL SZ7.5 (GLOVE) ×3 IMPLANT
GLOVE INDICATOR 8.0 STRL GRN (GLOVE) ×3 IMPLANT
GOWN STRL REUS W/TWL XL LVL3 (GOWN DISPOSABLE) ×9 IMPLANT
HOVERMATT SINGLE USE (MISCELLANEOUS) ×3 IMPLANT
KIT BASIN OR (CUSTOM PROCEDURE TRAY) ×3 IMPLANT
MARKER SKIN DUAL TIP RULER LAB (MISCELLANEOUS) ×3 IMPLANT
NDL SPNL 22GX3.5 QUINCKE BK (NEEDLE) ×1 IMPLANT
NEEDLE SPNL 22GX3.5 QUINCKE BK (NEEDLE) ×3 IMPLANT
PACK UNIVERSAL I (CUSTOM PROCEDURE TRAY) ×3 IMPLANT
QUICK LOAD TK 5 (STAPLE)
RELOAD STAPLE 60 3.6 BLU REG (STAPLE) IMPLANT
RELOAD STAPLE 60 3.8 GOLD REG (STAPLE) IMPLANT
RELOAD STAPLE 60 4.1 GRN THCK (STAPLE) IMPLANT
RELOAD STAPLER BLUE 60MM (STAPLE) ×5 IMPLANT
RELOAD STAPLER GOLD 60MM (STAPLE) IMPLANT
RELOAD STAPLER GREEN 60MM (STAPLE) ×2 IMPLANT
SCISSORS LAP 5X45 EPIX DISP (ENDOMECHANICALS) ×3 IMPLANT
SEALANT SURGICAL APPL DUAL CAN (MISCELLANEOUS) IMPLANT
SET IRRIG TUBING LAPAROSCOPIC (IRRIGATION / IRRIGATOR) ×3 IMPLANT
SHEARS HARMONIC ACE PLUS 45CM (MISCELLANEOUS) ×3 IMPLANT
SLEEVE ADV FIXATION 5X100MM (TROCAR) ×6 IMPLANT
SLEEVE GASTRECTOMY 40FR VISIGI (MISCELLANEOUS) ×3 IMPLANT
SLEEVE XCEL OPT CAN 5 100 (ENDOMECHANICALS) IMPLANT
SOLUTION ANTI FOG 6CC (MISCELLANEOUS) ×3 IMPLANT
SPONGE LAP 18X18 X RAY DECT (DISPOSABLE) ×3 IMPLANT
STAPLER ECHELON BIOABSB 60 FLE (MISCELLANEOUS) ×6 IMPLANT
STAPLER ECHELON LONG 60 440 (INSTRUMENTS) ×2 IMPLANT
STAPLER RELOAD BLUE 60MM (STAPLE) ×15
STAPLER RELOAD GOLD 60MM (STAPLE)
STAPLER RELOAD GREEN 60MM (STAPLE) ×6
SUT MNCRL AB 4-0 PS2 18 (SUTURE) ×3 IMPLANT
SUT SURGIDAC NAB ES-9 0 48 120 (SUTURE) IMPLANT
SUT VICRYL 0 TIES 12 18 (SUTURE) ×3 IMPLANT
SYR 10ML ECCENTRIC (SYRINGE) ×3 IMPLANT
SYR 20CC LL (SYRINGE) ×3 IMPLANT
SYR 50ML LL SCALE MARK (SYRINGE) ×3 IMPLANT
TOWEL OR 17X26 10 PK STRL BLUE (TOWEL DISPOSABLE) ×3 IMPLANT
TOWEL OR NON WOVEN STRL DISP B (DISPOSABLE) ×3 IMPLANT
TRAY FOLEY W/METER SILVER 14FR (SET/KITS/TRAYS/PACK) IMPLANT
TRAY FOLEY W/METER SILVER 16FR (SET/KITS/TRAYS/PACK) IMPLANT
TROCAR ADV FIXATION 5X100MM (TROCAR) ×3 IMPLANT
TROCAR BLADELESS 15MM (ENDOMECHANICALS) ×3 IMPLANT
TROCAR BLADELESS OPT 5 100 (ENDOMECHANICALS) ×3 IMPLANT
TUBING CONNECTING 10 (TUBING) ×2 IMPLANT
TUBING CONNECTING 10' (TUBING) ×1
TUBING ENDO SMARTCAP (MISCELLANEOUS) ×3 IMPLANT
TUBING INSUF HEATED (TUBING) ×3 IMPLANT

## 2015-10-18 NOTE — Op Note (Signed)
10/18/2015 Terry Salazar 1960/09/09 [id number]   PRE-OPERATIVE DIAGNOSIS:   Morbid obesity BMI 37 Prediabetes Dyslipidemia Osteoarthritis b/l knees  POST-OPERATIVE DIAGNOSIS:  same  PROCEDURE:  Procedure(s): LAPAROSCOPIC SLEEVE GASTRECTOMY  UPPER GI ENDOSCOPY  SURGEON:  Surgeon(s): Gayland Curry, MD FACS FASMBS  ASSISTANTS: Alphonsa Overall MD FACS  ANESTHESIA:   general  DRAINS: none   BOUGIE: 40 fr ViSiGi  LOCAL MEDICATIONS USED:  MARCAINE + Exparel  SPECIMEN:  Source of Specimen:  Greater curvature of stomach  DISPOSITION OF SPECIMEN:  PATHOLOGY  COUNTS:  YES  INDICATION FOR PROCEDURE: This is a very pleasant 55 year old morbidly obese female who has had unsuccessful attempts for sustained weight loss. She presents today for a planned laparoscopic sleeve gastrectomy with upper endoscopy. We have discussed the risk and benefits of the procedure extensively preoperatively. Please see my separate notes.  PROCEDURE: After obtaining informed consent and receiving 5000 units of subcutaneous heparin, the patient was brought to the operating room at [facility] and placed supine on the operating room table. General endotracheal anesthesia was established. Sequential compression devices were placed. A orogastric tube was placed. The patient's abdomen was prepped and draped in the usual standard surgical fashion. She received preoperative IV antibiotics. A surgical timeout was performed.  Access to the abdomen was achieved using a 5 mm 0 laparoscope thru a 5 mm trocar In the left upper Quadrant 2 fingerbreadths below the left subcostal margin using the Optiview technique. Pneumoperitoneum was smoothly established up to 15 mm of mercury. The laparoscope was advanced and the abdominal cavity was surveilled. The patient was then placed in reverse Trendelenburg. There was no evidence of a hiatal hernia on laparoscopy - gap in the left and right crus anteriorly.  A 5 mm trocar was  placed slightly above and to the left of the umbilicus under direct visualization.  The [facility] liver retractor was placed under the left lobe of the liver through a 5 mm trocar incision site in the subxiphoid position. A 5 mm trocar was placed in the lateral right upper quadrant along with a 15 mm trocar in the mid right abdomen. A final 5 mm trocar was placed in the lateral LUQ.  All under direct visualization after local had been infiltrated.  The stomach was inspected. It was completely decompressed and the orogastric tube was removed.  There was no anterior dimple that was obviously visible. Her preop UGI showed no hiatal hernia.We identified the pylorus and measured 6 cm proximal to the pylorus and identified an area of where we would start taking down the short gastric vessels. Harmonic scalpel was used to take down the short gastric vessels along the greater curvature of the stomach. We were able to enter the lesser sac. We continued to march along the greater curvature of the stomach taking down the short gastrics. As we approached the gastrosplenic ligament we took care in this area not to injure the spleen. We were able to take down the entire gastrosplenic ligament. We then mobilized the fundus away from the left crus of diaphragm. There were not any significant posterior gastric avascular attachments. This left the stomach completely mobilized. No vessels had been taken down along the lesser curvature of the stomach.  We then reidentified the pylorus. A 40Fr ViSiGi was then placed in the oropharynx and advanced down into the stomach and placed in the distal antrum and positioned along the lesser curvature. It was placed under suction which secured the 40Fr ViSiGi in place  along the lesser curve. Then using the Ethicon echelon 60 mm stapler with a green load with Seamguard, I placed a stapler along the antrum approximately 5 cm from the pylorus. The stapler was angled so that there is ample room  at the angularis incisura. I then fired the first staple load after inspecting it posteriorly to ensure adequate space both anteriorly and posteriorly. At this point I still was not completely past the angularis so with another green load with Seamguard, I placed the stapler in position just inside the prior stapleline. We then rotated the stomach to insure that there was adequate anteriorly as well as posteriorly. The stapler was then fired. I used another 51mm green cartridge with seamguard. At this point I started using 60 mm gold load staple cartridges with Seamguard. The echelon stapler was then repositioned with a 60 mm gold load with Seamguard and we continued to march up along the [place]. My assistant was holding traction along the greater curvature stomach along the cauterized short gastric vessels ensuring that the stomach was symmetrically retracted. Prior to each firing of the staple, we rotated the stomach to ensure that there is adequate stomach left.  As we approached the fundus, I used 60 mm blue cartridge with Seamguard aiming slightly lateral to the esophageal fat pad. Although the staples on this fire had completely gone thru the last part of the stomach it had not completely cut it. Therefore 1 additional 60 blue load was used to free the remaining stomach. The sleeve was inspected. There is no evidence of cork screw. The staple line appeared hemostatic. The CRNA inflated the ViSiGi to the green zone and the upper abdomen was flooded with saline. There were no bubbles. The sleeve was decompressed and the ViSiGi removed. My assistant scrubbed out and performed an upper endoscopy. The sleeve easily distended with air and the scope was easily advanced to the pylorus. There is no evidence of internal bleeding or cork screwing. There was no narrowing at the angularis. There is no evidence of bubbles. Please see his operative note for further details. The gastric sleeve was decompressed and the  endoscope was removed.  The greater curvature the stomach was grasped with a laparoscopic grasper and removed from the 15 mm trocar site.  The liver retractor was removed. I then closed the 15 mm trocar site with 1 interrupted 0 Vicryl sutures through the fascia using the endoclose. The closure was viewed laparoscopically and it was airtight. 70 cc of Exparel was then infiltrated in the preperitoneal spaces around the trocar sites. Pneumoperitoneum was released. All trocar sites were closed with a 4-0 Monocryl in a subcuticular fashion followed by the application of Dermabond. The patient was extubated and taken to the recovery room in stable condition. All needle, instrument, and sponge counts were correct x2. There are no immediate complications  (2) 60 mm green with Seamguard (1) 60 mm gold with seamguard (3) 60 mm blue with seamguard  PLAN OF CARE: Admit to inpatient   PATIENT DISPOSITION:  PACU - hemodynamically stable.   Delay start of Pharmacological VTE agent (>24hrs) due to surgical blood loss or risk of bleeding:  no  Leighton Ruff. Redmond Pulling, MD, FACS FASMBS General, Bariatric, & Minimally Invasive Surgery [facility], [place]

## 2015-10-18 NOTE — Interval H&P Note (Signed)
History and Physical Interval Note:  10/18/2015 7:11 AM  Terry Salazar  has presented today for surgery, with the diagnosis of MORBID OBESITY  The various methods of treatment have been discussed with the patient and family. After consideration of risks, benefits and other options for treatment, the patient has consented to  Procedure(s): LAPAROSCOPIC GASTRIC SLEEVE RESECTION W/UPPER ENDO (N/A) as a surgical intervention .  The patient's history has been reviewed, patient examined, no change in status, stable for surgery.  I have reviewed the patient's chart and labs.  Questions were answered to the patient's satisfaction.    Leighton Ruff. Redmond Pulling, MD, [place], Bariatric, & Minimally Invasive Surgery [facility], [place]   [facility]

## 2015-10-18 NOTE — H&P (View-Only) (Signed)
Terry Salazar 10/06/2015 4:20 PM Location: [facility] Patient #: [id number] DOB: 21-Mar-1961 Divorced / Language: English / Race: Black or African American Female  History of Present Illness Terry Hiss M. Eddye Broxterman MD; 10/06/2015 5:18 PM) The patient is a 55 year old female who presents for a preoperative evaluation. She comes in today for her preoperative appointment. She has been approved and scheduled for laparoscopic sleeve gastrectomy. Her surgery date is March 14. She was initially scheduled for surgery in December 2016 back in December but had to cancel her surgery due to a family member needing emergent surgery. she had some outpatient rehabilitation for right arm nerve pain. Otherwise she denies any medical changes. She denies any chest pain, chest pressure, source of breath, dyspnea on exertion, TIAs or amaurosis fugax. She denies any heartburn or reflux. She denies any abdominal pain. She denies any diarrhea or constipation 07/07/2015 She was initally scheduled for today for a preop appointment for her laparoscopic sleeve gastrectomy for which she has been approved. However after arriving today she tells [place] that she is going to have to postpone surgery since her son has to be scheduled for unplanned orthopedic surgery. She denies any changes since she was seen. She denies any medical changes. Her abdominal u/s showed a fatty liver. Her UGI showed no hiatal hernia. Her evaluation blood work was done earlier in the year. Her A1C was 6. H pylori was negative. CMP was normal. Total cholesterol 202, LDL 110. Hgb was 11.8; HCT was 34.9.   Problem List/Past Medical Terry Hiss Terry Derby, MD;  10/06/2015 5:18 PM) OSTEOARTHRITIS OF BOTH KNEES, UNSPECIFIED OSTEOARTHRITIS TYPE (M17.0) OBESITY (BMI 30-39.9) (E66.9) PREDIABETES (R73.03) ELEVATED LDL CHOLESTEROL LEVEL (E78.00)  Other Problems Terry Curry, MD; 10/06/2015 5:18 PM) Arthritis Asthma Back Pain Hemorrhoids Hypercholesterolemia  Past Surgical History Terry Curry, MD; 10/06/2015 5:18 PM) Hysterectomy (not due to cancer) - Partial Oral Surgery  Diagnostic Studies History Terry Curry, MD; 10/06/2015 5:18 PM) Colonoscopy 1-5 years ago Mammogram 1-3 years ago Pap Smear 1-5 years ago  Allergies Terry Salazar, Terry Salazar; 10/06/2015 4:21 PM) Thimerosal *ANTISEPTICS & DISINFECTANTS*  Medication History Terry Curry, MD; 10/06/2015 5:18 PM) Read Drivers (10MG  Capsule, Oral) Active. Benadryl Allergy (25MG  Capsule, Oral) Active. ZyrTEC Allergy (10MG  Capsule, Oral) Active. Meclizine HCl (25MG  Tablet, Oral) Active. Mobic (15MG  Tablet, Oral) Active. Medications Reconciled OxyCODONE HCl (5MG /5ML Solution, 5-10 Oral every four hours, as needed, Taken starting 10/06/2015) Active. Zofran ODT (4MG  Tablet Disperse, 1 (one) Oral every four hours, as needed, Taken starting 10/06/2015) Active. Protonix (40MG  Tablet DR, 1 (one) Oral daily, Taken starting 10/06/2015) Active. B Complex + C TR (Oral) Active. Lipitor (10MG  Tablet, Oral) Active. Valtrex (500MG  Tablet, Oral) Active.  Social History Terry Curry, MD; 10/06/2015 5:18 PM) Alcohol use Moderate alcohol use. Caffeine use Carbonated beverages, Tea. Illicit drug use Remotely quit drug use. Tobacco use Never smoker.  Family History Terry Curry, MD; 10/06/2015 5:18 PM) Prostate Cancer Family Members In General. Heart Disease Father. Diabetes Mellitus Father, Mother. Hypertension Family Members In General, Father. Heart disease in female family member before age 39 Cancer Family Members In General. Cerebrovascular Accident Family Members In  Terry Salazar Members In General. Colon Polyps Father, Mother. Breast Cancer Family Members In General. Alcohol Abuse Father. Bleeding disorder Mother. Arthritis Mother.  Pregnancy / Birth History Terry Curry, MD; 10/06/2015 5:18 PM) Age at menarche 87 years. Gravida 3 Maternal age 32-25 Para 1     Review of Systems (  Terry Ruff. Neetu Carrozza MD; 10/06/2015 5:18 PM) General Present- Weight Gain. Not Present- Appetite Loss, Chills, Fatigue, Fever, Night Sweats and Weight Loss. Skin Present- Dryness. Not Present- Change in Wart/Mole, Hives, Jaundice, New Lesions, Non-Healing Wounds, Rash and Ulcer. HEENT Present- Hearing Loss and Ringing in the Ears. Not Present- Earache, Hoarseness, Nose Bleed, Oral Ulcers, Seasonal Allergies, Sinus Pain, Sore Throat, Visual Disturbances, Wears glasses/contact lenses and Yellow Eyes. Respiratory Present- Snoring. Not Present- Bloody sputum, Chronic Cough, Difficulty Breathing and Wheezing. Breast Not Present- Breast Mass, Breast Pain, Nipple Discharge and Skin Changes. Cardiovascular Not Present- Chest Pain, Difficulty Breathing Lying Down, Leg Cramps, Palpitations, Rapid Heart Rate, Shortness of Breath and Swelling of Extremities. Gastrointestinal Present- Hemorrhoids. Not Present- Abdominal Pain, Bloating, Bloody Stool, Change in Bowel Habits, Chronic diarrhea, Constipation, Difficulty Swallowing, Excessive gas, Gets full quickly at meals, Indigestion, Nausea, Rectal Pain and Vomiting. Female Genitourinary Not Present- Frequency, Nocturia, Painful Urination, Pelvic Pain and Urgency. Musculoskeletal Present- Joint Pain and Swelling of Extremities. Not Present- Back Pain, Joint Stiffness, Muscle Pain and Muscle Weakness. Neurological Present- Numbness. Not Present- Decreased Memory, Fainting, Headaches, Seizures, Tingling, Tremor, Trouble walking and Weakness. Psychiatric Not Present- Anxiety, Bipolar, Change in Sleep Pattern, Depression,  Fearful and Frequent crying. Endocrine Not Present- Cold Intolerance, Excessive Hunger, Hair Changes, Heat Intolerance, Hot flashes and New Diabetes. Hematology Not Present- Easy Bruising, Excessive bleeding, Gland problems, HIV and Persistent Infections.  Vitals Terry Salazar; 10/06/2015 4:21 PM) 10/06/2015 4:21 PM Weight: 273.6 lb Height: 72in Body Surface Area: 2.43 m Body Mass Index: 37.11 kg/m  Temp.: 97.40F  Pulse: 80 (Regular)  BP: 138/78 (Sitting, Left Arm, Standard)      Physical Exam Terry Hiss M. Bradan Congrove MD; 10/06/2015 5:11 PM)  General Mental Status-Alert. General Appearance-Consistent with stated age. Hydration-Well hydrated. Voice-Normal. Note: obesity  Head and Neck Head-normocephalic, atraumatic with no lesions or palpable masses. Trachea-midline. Thyroid Gland Characteristics - normal size and consistency.  Eye Eyeball - Bilateral-Extraocular movements intact. Sclera/Conjunctiva - Bilateral-No scleral icterus.  Chest and Lung Exam Chest and lung exam reveals -quiet, even and easy respiratory effort with no use of accessory muscles and on auscultation, normal breath sounds, no adventitious sounds and normal vocal resonance. Inspection Chest Wall - Normal. Back - normal.  Breast - Did not examine.  Cardiovascular Cardiovascular examination reveals -normal heart sounds, regular rate and rhythm with no murmurs and normal pedal pulses bilaterally.  Abdomen Inspection Inspection of the abdomen reveals - No Hernias. Skin - Scar - no surgical scars. Palpation/Percussion Palpation and Percussion of the abdomen reveal - Soft, Non Tender, No Rebound tenderness, No Rigidity (guarding) and No hepatosplenomegaly. Auscultation Auscultation of the abdomen reveals - Bowel sounds normal.  Peripheral Vascular Upper Extremity Palpation - Pulses bilaterally normal.  Neurologic Neurologic evaluation reveals -alert and oriented x 3 with  no impairment of recent or remote memory. Mental Status-Normal.  Neuropsychiatric The patient's mood and affect are described as -normal. Judgment and Insight-insight is appropriate concerning matters relevant to self.  Musculoskeletal Normal Exam - Left-Upper Extremity Strength Normal and Lower Extremity Strength Normal. Normal Exam - Right-Upper Extremity Strength Normal and Lower Extremity Strength Normal. Note: BILATERAL KNEE CREPITUS  Lymphatic Head & Neck  General Head & Neck Lymphatics: Bilateral - Description - Normal. Axillary - Did not examine. Femoral & Inguinal - Did not examine.    Assessment & Plan Terry Hiss M. Javani Spratt MD; 10/06/2015 5:10 PM)  OBESITY (BMI 30-39.9) (E66.9) Impression: We reviewed her preoperative workup discussing her UGI, ultrasound, and labs.  Her PCP addressed the Hgb A1C. We rediscussed the typical operative course and postop course. All of her questions were asked and answered. At this point I went ahead and gave her postop pain, nausea, reflux prescriptions.  Current Plans Pt Education - EMW_preopbariatric Continued OxyCODONE HCl 5MG /5ML, 5-10 Milliliter every four hours, as needed, 200 Milliliter, 10/06/2015, No Refill. Continued Zofran ODT 4MG , 1 (one) Tablet Disperse every four hours, as needed, #20, 10/06/2015, No Refill. Continued Protonix 40MG , 1 (one) Tablet daily, #30, 10/06/2015, Ref. x1. PREDIABETES (R73.03)  ELEVATED LDL CHOLESTEROL LEVEL (E78.00) Impression: Per PCP  OSTEOARTHRITIS OF BOTH KNEES, UNSPECIFIED OSTEOARTHRITIS TYPE (M17.0) Impression: Per orthopedic surgeon  Terry Ruff. Redmond Pulling, MD, FACS General, Bariatric, & Minimally Invasive Surgery [facility], [place]

## 2015-10-18 NOTE — Anesthesia Postprocedure Evaluation (Signed)
Anesthesia Post Note  Patient: Terry Salazar  Procedure(s) Performed: Procedure(s) (LRB): LAPAROSCOPIC GASTRIC SLEEVE RESECTION W/UPPER ENDO (N/A)  Patient location during evaluation: PACU Anesthesia Type: General Level of consciousness: awake and alert Pain management: pain level controlled Vital Signs Assessment: post-procedure vital signs reviewed and stable Respiratory status: spontaneous breathing, nonlabored ventilation, respiratory function stable and patient connected to nasal cannula oxygen Cardiovascular status: blood pressure returned to baseline and stable Postop Assessment: no signs of nausea or vomiting Anesthetic complications: no    Last Vitals:  Filed Vitals:   10/18/15 1215 10/18/15 1230  BP: 170/93 168/94  Pulse: 95 97  Temp:  36.6 C  Resp: 25 22    Last Pain:  Filed Vitals:   10/18/15 1233  PainSc: [place] Yerick Eggebrecht

## 2015-10-18 NOTE — Anesthesia Procedure Notes (Signed)
Procedure Name: Intubation Date/Time: 10/18/2015 7:23 AM Performed by: Ofilia Neas Pre-anesthesia Checklist: Patient identified, Emergency Drugs available, Suction available, Timeout performed and Patient being monitored Patient Re-evaluated:Patient Re-evaluated prior to inductionOxygen Delivery Method: Circle system utilized Preoxygenation: Pre-oxygenation with 100% oxygen Intubation Type: IV induction and Cricoid Pressure applied Ventilation: Mask ventilation without difficulty and Mask ventilation throughout procedure Laryngoscope Size: Mac and 4 Grade View: Grade II Tube type: Oral Tube size: 7.5 mm Number of attempts: 1 Airway Equipment and Method: Stylet Secured at: 21 cm Tube secured with: Tape Dental Injury: Teeth and Oropharynx as per pre-operative assessment

## 2015-10-18 NOTE — Op Note (Signed)
Name:  Terry Salazar MRN: [id number] Date of Surgery: 10/18/2015  Preop Diagnosis:  Morbid Obesity  Postop Diagnosis:  Morbid Obesity (Weight - 273, BMI - 37.1), S/P Gastric Sleeve  Procedure:  Upper endoscopy  (Intraoperative)  Surgeon:  Alphonsa Overall, M.D.  Anesthesia:  GET  Indications for procedure: Terry Salazar is a 55 y.o. female whose primary care physician is Tivis Ringer, MD and has completed a Gastric Sleeve today by Dr. Redmond Pulling.  I am doing an intraoperative upper endoscopy to evaluate the gastric pouch.  Operative Note: The patient is under general anesthesia.  Dr. Redmond Pulling is laparoscoping the patient while I do an upper endoscopy to evaluate the stomach pouch.  With the patient intubated, I passed the Pentax upper endoscope without difficulty down the esophagus.  The esophago-gastric junction was at 38 cm.    The mucosa of the stomach looked viable and the staple line was intact without bleeding.  I advanced to the pylorus, but did not go through it.  While I insufflated the stomach pouch with air, Dr. Redmond Pulling  flooded the upper abdomen with saline to put the gastric pouch under saline.  There was no bubbling or evidence of a leak.  Photos were taken of the gastric pouch.  There was no evidence of narrowing of the pouch and the gastric sleeve looked tubular.  The scope was then withdrawn.  The esophagus was unremarkable and the patient tolerated the endoscopy without difficulty.  Alphonsa Overall, MD, [facility] Pager: [phone] Office phone:  [phone]

## 2015-10-18 NOTE — Progress Notes (Signed)
MD made aware of patient elevated blood pressure throughout day.  Orders received

## 2015-10-18 NOTE — Transfer of Care (Signed)
Immediate Anesthesia Transfer of Care Note  Patient: Terry Salazar  Procedure(s) Performed: Procedure(s): LAPAROSCOPIC GASTRIC SLEEVE RESECTION W/UPPER ENDO (N/A)  Patient Location: PACU  Anesthesia Type:General  Level of Consciousness: awake, oriented, patient cooperative, lethargic and responds to stimulation  Airway & Oxygen Therapy: Patient Spontanous Breathing and Patient connected to face mask oxygen  Post-op Assessment: Report given to RN, Post -op Vital signs reviewed and stable and Patient moving all extremities  Post vital signs: Reviewed and stable  Last Vitals:  Filed Vitals:   10/18/15 0520  BP: 148/95  Pulse: 91  Temp: 36.5 C  Resp: 18    Complications: No apparent anesthesia complications

## 2015-10-19 ENCOUNTER — Encounter (HOSPITAL_COMMUNITY): Payer: Self-pay | Admitting: General Surgery

## 2015-10-19 LAB — CBC WITH DIFFERENTIAL/PLATELET
BASOS ABS: 0 10*3/uL (ref 0.0–0.1)
BASOS PCT: 0 %
EOS ABS: 0 10*3/uL (ref 0.0–0.7)
EOS PCT: 0 %
HCT: 34.9 % — ABNORMAL LOW (ref 36.0–46.0)
Hemoglobin: 11.6 g/dL — ABNORMAL LOW (ref 12.0–15.0)
LYMPHS PCT: 14 %
Lymphs Abs: 1.2 10*3/uL (ref 0.7–4.0)
MCH: 28.2 pg (ref 26.0–34.0)
MCHC: 33.2 g/dL (ref 30.0–36.0)
MCV: 84.9 fL (ref 78.0–100.0)
MONO ABS: 0.6 10*3/uL (ref 0.1–1.0)
Monocytes Relative: 7 %
Neutro Abs: 6.9 10*3/uL (ref 1.7–7.7)
Neutrophils Relative %: 79 %
PLATELETS: 269 10*3/uL (ref 150–400)
RBC: 4.11 MIL/uL (ref 3.87–5.11)
RDW: 14 % (ref 11.5–15.5)
WBC: 8.8 10*3/uL (ref 4.0–10.5)

## 2015-10-19 LAB — COMPREHENSIVE METABOLIC PANEL
ALBUMIN: 4.2 g/dL (ref 3.5–5.0)
ALT: 62 U/L — AB (ref 14–54)
AST: 60 U/L — AB (ref 15–41)
Alkaline Phosphatase: 72 U/L (ref 38–126)
Anion gap: 10 (ref 5–15)
BUN: 11 mg/dL (ref 6–20)
CHLORIDE: 105 mmol/L (ref 101–111)
CO2: 25 mmol/L (ref 22–32)
CREATININE: 0.87 mg/dL (ref 0.44–1.00)
Calcium: 9.4 mg/dL (ref 8.9–10.3)
GFR calc Af Amer: >60 mL/min (ref >60)
Glucose, Bld: 132 mg/dL — ABNORMAL HIGH (ref 65–99)
POTASSIUM: 4.3 mmol/L (ref 3.5–5.1)
SODIUM: 140 mmol/L (ref 135–145)
Total Bilirubin: 0.5 mg/dL (ref 0.3–1.2)
Total Protein: 8.1 g/dL (ref 6.5–8.1)

## 2015-10-19 LAB — HEMOGLOBIN AND HEMATOCRIT, BLOOD
HCT: 34.9 % — ABNORMAL LOW (ref 36.0–46.0)
HEMOGLOBIN: 11.6 g/dL — AB (ref 12.0–15.0)

## 2015-10-19 MED ORDER — ENALAPRILAT 1.25 MG/ML IV SOLN
1.2500 mg | Freq: Four times a day (QID) | INTRAVENOUS | Status: DC | PRN
  Filled 2015-10-19: qty 1

## 2015-10-19 MED ORDER — METOPROLOL TARTRATE 1 MG/ML IV SOLN: 5.0000 mg | Freq: Four times a day (QID) | INTRAVENOUS | Status: DC | PRN

## 2015-10-19 MED ORDER — ENALAPRILAT 1.25 MG/ML IV SOLN
1.2500 mg | Freq: Four times a day (QID) | INTRAVENOUS | Status: DC
  Filled 2015-10-19 (×3): qty 1

## 2015-10-19 NOTE — Addendum Note (Signed)
Addendum  created 10/19/15 1219 by Lollie Sails, CRNA   Modules edited: Charges VN

## 2015-10-19 NOTE — Discharge Instructions (Signed)

## 2015-10-19 NOTE — Care Management Note (Signed)
Case Management Note  Patient Details  Name: Terry Salazar MRN: [id number] Date of Birth: 10/25/1960  Subjective/Objective:  S/p laparoscopic sleeve gastrectomy with upper endoscopy                  Action/Plan: Discharge planning, no HH needs identified  Expected Discharge Date:                  Expected Discharge Plan:  Home/Self Care  In-House Referral:  NA  Discharge planning Services  CM Consult  Post Acute Care Choice:  NA Choice offered to:  NA  DME Arranged:  N/A DME Agency:  NA  HH Arranged:  NA HH Agency:  NA  Status of Service:  Completed, signed off  Medicare Important Message Given:    Date Medicare IM Given:    Medicare IM give by:    Date Additional Medicare IM Given:    Additional Medicare Important Message give by:     If discussed at [place] of Stay Meetings, dates discussed:    Additional Comments:  Guadalupe Maple, RN 10/19/2015, 9:57 AM [phone]

## 2015-10-19 NOTE — Progress Notes (Signed)
Nutrition Education Note  Received consult for diet education per DROP protocol.   Discussed 2 week post op diet with pt. Emphasized that liquids must be non carbonated, non caffeinated, and sugar free. Fluid goals discussed. Reviewed progression of diet to include soft proteins at 7-10 days post-op. Pt to follow up with outpatient bariatric RD for further diet progression after 2 weeks. Multivitamins and minerals also reviewed. Teach back method used, pt expressed understanding, expect good compliance.   Diet: First 2 Weeks  You will see the dietitian about two (2) weeks after your surgery. The dietitian will increase the types of foods you can eat if you are handling liquids well:  If you have severe vomiting or nausea and cannot handle clear liquids lasting longer than 1 day, call your surgeon  Protein Shake  Drink at least 2 ounces of shake 5-6 times per day  Each serving of protein shakes (usually 8 - 12 ounces) should have a minimum of:  15 grams of protein  And no more than 5 grams of carbohydrate  Goal for protein each day:  Men = 80 grams per day  Women = 60 grams per day  Protein powder may be added to fluids such as non-fat milk or Lactaid milk or Soy milk (limit to 35 grams added protein powder per serving)   Hydration  Slowly increase the amount of water and other clear liquids as tolerated (See Acceptable Fluids)  Slowly increase the amount of protein shake as tolerated  Sip fluids slowly and throughout the day  May use sugar substitutes in small amounts (no more than 6 - 8 packets per day; i.e. Splenda)   Fluid Goal  The first goal is to drink at least 8 ounces of protein shake/drink per day (or as directed by the nutritionist); some examples of protein shakes are Johnson & Johnson, AMR Corporation, EAS Edge HP, and Unjury. See handout from pre-op Bariatric Education Class:  Slowly increase the amount of protein shake you drink as tolerated  You may find it easier to slowly  sip shakes throughout the day  It is important to get your proteins in first  Your fluid goal is to drink 64 - 100 ounces of fluid daily  It may take a few weeks to build up to this  32 oz (or more) should be clear liquids  And  32 oz (or more) should be full liquids (see below for examples)  Liquids should not contain sugar, caffeine, or carbonation   Clear Liquids:  Water or Sugar-free flavored water (i.e. Fruit H2O, Propel)  Decaffeinated coffee or tea (sugar-free)  Crystal Lite, Wyler's Lite, Minute Maid Lite  Sugar-free Jell-O  Bouillon or broth  Sugar-free Popsicle: *Less than 20 calories each; Limit 1 per day   Full Liquids:  Protein Shakes/Drinks + 2 choices per day of other full liquids  Full liquids must be:  No More Than 12 grams of Carbs per serving  No More Than 3 grams of Fat per serving  Strained low-fat cream soup  Non-Fat milk  Fat-free Lactaid Milk  Sugar-free yogurt (Dannon Lite & Fit, Greek yogurt)

## 2015-10-19 NOTE — Plan of Care (Signed)
Problem: Food- and Nutrition-Related Knowledge Deficit (NB-1.1) Goal: Nutrition education Formal process to instruct or train a patient/client in a skill or to impart knowledge to help patients/clients voluntarily manage or modify food choices and eating behavior to maintain or improve health. Outcome: Progressing Nutrition Education Note  Received consult for diet education per DROP protocol.   Discussed 2 week post op diet with Terry Salazar. Emphasized that liquids must be non carbonated, non caffeinated, and sugar free. Fluid goals discussed. Reviewed progression of diet to include soft proteins at 7-10 days post-op. Terry Salazar to follow up with outpatient bariatric RD for further diet progression after 2 weeks. Multivitamins and minerals also reviewed. Teach back method used, Terry Salazar expressed understanding, expect good compliance.   Diet: First 2 Weeks  You will see the dietitian about two (2) weeks after your surgery. The dietitian will increase the types of foods you can eat if you are handling liquids well:  If you have severe vomiting or nausea and cannot handle clear liquids lasting longer than 1 day, call your surgeon  Protein Shake  Drink at least 2 ounces of shake 5-6 times per day  Each serving of protein shakes (usually 8 - 12 ounces) should have a minimum of:  15 grams of protein  And no more than 5 grams of carbohydrate  Goal for protein each day:  Men = 80 grams per day  Women = 60 grams per day  Protein powder may be added to fluids such as non-fat milk or Lactaid milk or Soy milk (limit to 35 grams added protein powder per serving)   Hydration  Slowly increase the amount of water and other clear liquids as tolerated (See Acceptable Fluids)  Slowly increase the amount of protein shake as tolerated  Sip fluids slowly and throughout the day  May use sugar substitutes in small amounts (no more than 6 - 8 packets per day; i.e. Splenda)   Fluid Goal  The first goal is to drink at least 8 ounces  of protein shake/drink per day (or as directed by the nutritionist); some examples of protein shakes are Johnson & Johnson, AMR Corporation, EAS Edge HP, and Unjury. See handout from pre-op Bariatric Education Class:  Slowly increase the amount of protein shake you drink as tolerated  You may find it easier to slowly sip shakes throughout the day  It is important to get your proteins in first  Your fluid goal is to drink 64 - 100 ounces of fluid daily  It may take a few weeks to build up to this  32 oz (or more) should be clear liquids  And  32 oz (or more) should be full liquids (see below for examples)  Liquids should not contain sugar, caffeine, or carbonation   Clear Liquids:  Water or Sugar-free flavored water (i.e. Fruit H2O, Propel)  Decaffeinated coffee or tea (sugar-free)  Crystal Lite, Wyler's Lite, Minute Maid Lite  Sugar-free Jell-O  Bouillon or broth  Sugar-free Popsicle: *Less than 20 calories each; Limit 1 per day   Full Liquids:  Protein Shakes/Drinks + 2 choices per day of other full liquids  Full liquids must be:  No More Than 12 grams of Carbs per serving  No More Than 3 grams of Fat per serving  Strained low-fat cream soup  Non-Fat milk  Fat-free Lactaid Milk  Sugar-free yogurt (Dannon Lite & Fit, Greek yogurt)

## 2015-10-19 NOTE — Progress Notes (Signed)
1 Day Post-Op  Subjective: Some HTN yesterday. Had nausea and abd discomfort yesterday. Less nausea this am. Some epigastric discomfort/reflux this am. Ambulated.   Objective: Vital signs in last 24 hours: Temp:  [97.6 F (36.4 C)-98.4 F (36.9 C)] 97.8 F (36.6 C) (03/15 0616) Pulse Rate:  [70-97] 70 (03/15 0616) Resp:  [16-31] 16 (03/15 0616) BP: (138-190)/(77-104) 157/86 mmHg (03/15 0616) SpO2:  [93 %-100 %] 100 % (03/15 0616) Weight:  [124.785 kg (275 lb 1.6 oz)] 124.785 kg (275 lb 1.6 oz) (03/15 0616)    Intake/Output from previous day: 03/14 0701 - 03/15 0700 In: 3566.7 [I.V.:3566.7] Out: 4000 [Urine:4000] Intake/Output this shift:    Alert, resting comfortably. Not ill appearing cta b/l Reg Soft, obese, incisions c/d/i; approp mild TTP No edema  Lab Results:   Recent Labs  10/18/15 1019 10/19/15 0433  WBC  --  8.8  HGB 10.5* 11.6*  HCT 32.2* 34.9*  PLT  --  269   BMET  Recent Labs  10/19/15 0433  NA 140  K 4.3  CL 105  CO2 25  GLUCOSE 132*  BUN 11  CREATININE 0.87  CALCIUM 9.4   PT/INR No results for input(s): LABPROT, INR in the last 72 hours. ABG No results for input(s): PHART, HCO3 in the last 72 hours.  Invalid input(s): PCO2, PO2  Studies/Results: No results found.  Anti-infectives: Anti-infectives    Start     Dose/Rate Route Frequency Ordered Stop   10/18/15 0536  cefoTEtan in Dextrose 5% (CEFOTAN) IVPB 2 g     2 g Intravenous On call to O.R. 10/18/15 0536 10/18/15 0715      Assessment/Plan: s/p Procedure(s): LAPAROSCOPIC GASTRIC SLEEVE RESECTION W/UPPER ENDO (N/A)  No fever, no tachycardia. Looks well. Will start POD 1 diet HTN - improved today. Monitor. Prn vasotec vte prophylaxis - scds, lovenox Good uop Labs ok  Leighton Ruff. Redmond Pulling, MD, FACS General, Bariatric, & Minimally Invasive Surgery [facility], [place]   LOS: 1 day    Gayland Curry 10/19/2015

## 2015-10-19 NOTE — Progress Notes (Signed)
Patient alert and oriented, Post op day 1.  Provided support and encouragement.  Encouraged pulmonary toilet, ambulation and small sips of liquids.  All questions answered.  Will continue to monitor. 

## 2015-10-20 LAB — CBC WITH DIFFERENTIAL/PLATELET
BASOS ABS: 0 10*3/uL (ref 0.0–0.1)
Basophils Relative: 0 %
EOS ABS: 0.1 10*3/uL (ref 0.0–0.7)
EOS PCT: 2 %
HCT: 36.4 % (ref 36.0–46.0)
HEMOGLOBIN: 11.7 g/dL — AB (ref 12.0–15.0)
LYMPHS ABS: 1.8 10*3/uL (ref 0.7–4.0)
LYMPHS PCT: 27 %
MCH: 28.5 pg (ref 26.0–34.0)
MCHC: 32.1 g/dL (ref 30.0–36.0)
MCV: 88.8 fL (ref 78.0–100.0)
MONOS PCT: 6 %
Monocytes Absolute: 0.4 10*3/uL (ref 0.1–1.0)
Neutro Abs: 4.2 10*3/uL (ref 1.7–7.7)
Neutrophils Relative %: 65 %
PLATELETS: 246 10*3/uL (ref 150–400)
RBC: 4.1 MIL/uL (ref 3.87–5.11)
RDW: 13.9 % (ref 11.5–15.5)
WBC: 6.5 10*3/uL (ref 4.0–10.5)

## 2015-10-20 MED ORDER — OXYCODONE HCL 5 MG/5ML PO SOLN: 5.0000 mg | ORAL | Status: DC | PRN

## 2015-10-20 NOTE — Progress Notes (Signed)
Patient alert and oriented, pain is controlled. Patient is tolerating fluids, advanced to protein shake today, patient is tolerating well.  Reviewed Gastric sleeve discharge instructions with patient and patient is able to articulate understanding.  Provided information on BELT program, Support Group and [facility] outpatient pharmacy. All questions answered, will continue to monitor.  

## 2015-10-20 NOTE — Progress Notes (Signed)
Assessment unchanged. Pt and husband verbalized understanding of dc instructions regarding follow up care and when to call the doctor through teach back. Vilinda Flake, RN completed bariatric teaching. Discharged via wc accompanied by NT and husband to front entrance to meet awaiting vehicle to carry home.

## 2015-10-20 NOTE — Progress Notes (Signed)
2 Days Post-Op  Subjective: Much less nausea and discomfort. Some momentary discomfort with po in upper abd - lasts 1-2 sec. Ambulated. Already working on protein shake  Objective: Vital signs in last 24 hours: Temp:  [97.7 F (36.5 C)-98.5 F (36.9 C)] 98.4 F (36.9 C) (03/16 0556) Pulse Rate:  [70-84] 84 (03/16 0556) Resp:  [15-16] 16 (03/16 0556) BP: (158-180)/(82-96) 162/83 mmHg (03/16 0556) SpO2:  [98 %-100 %] 100 % (03/16 0556) Weight:  [124.6 kg (274 lb 11.1 oz)] 124.6 kg (274 lb 11.1 oz) (03/16 0556) Last BM Date: 10/17/15  Intake/Output from previous day: 03/15 0701 - 03/16 0700 In: 3000 [I.V.:3000] Out: 1300 [Urine:1300] Intake/Output this shift:    Alert, nad, nontoxic cta  Reg Obese, soft, incisions c/d/i, mild TTP No edema  Lab Results:   Recent Labs  10/19/15 0433 10/19/15 1609 10/20/15 0432  WBC 8.8  --  6.5  HGB 11.6* 11.6* 11.7*  HCT 34.9* 34.9* 36.4  PLT 269  --  246   BMET  Recent Labs  10/19/15 0433  NA 140  K 4.3  CL 105  CO2 25  GLUCOSE 132*  BUN 11  CREATININE 0.87  CALCIUM 9.4   PT/INR No results for input(s): LABPROT, INR in the last 72 hours. ABG No results for input(s): PHART, HCO3 in the last 72 hours.  Invalid input(s): PCO2, PO2  Studies/Results: No results found.  Anti-infectives: Anti-infectives    Start     Dose/Rate Route Frequency Ordered Stop   10/18/15 0536  cefoTEtan in Dextrose 5% (CEFOTAN) IVPB 2 g     2 g Intravenous On call to O.R. 10/18/15 0536 10/18/15 0715      Assessment/Plan: s/p Procedure(s): LAPAROSCOPIC GASTRIC SLEEVE RESECTION W/UPPER ENDO (N/A)  Doing well Pod 2 diet If does well this am, d/c later this am Discussed dc instructions  Leighton Ruff. Redmond Pulling, MD, FACS General, Bariatric, & Minimally Invasive Surgery [facility], [place]   LOS: 2 days    Gayland Curry 10/20/2015

## 2015-10-20 NOTE — Discharge Summary (Signed)
Physician Discharge Summary  Terry Salazar [id number] DOB: 1961/06/12 DOA: 10/18/2015  PCP: Tivis Ringer, MD  Admit date: 10/18/2015 Discharge date: 10/20/2015  Recommendations for Outpatient Follow-up:   Follow-up Information    Follow up with Gayland Curry, MD. Go on 11/02/2015.   Specialty:  General Surgery   Why:  For Post-Op Check at 8:45AM   Contact information:   [address] [place] [place] [address] [phone]      Discharge Diagnoses:  Active Problems:   S/P laparoscopic sleeve gastrectomy morbid obesity Prediabetes Dyslipidemia Osteoarthritis b/l knees  Surgical Procedure: Laparoscopic Sleeve Gastrectomy, upper endoscopy  Discharge Condition: Good Disposition: Home  Diet recommendation: Postoperative sleeve gastrectomy diet (liquids only)  Filed Weights   10/18/15 0520 10/19/15 0616 10/20/15 0556  Weight: 125.556 kg (276 lb 12.8 oz) 124.785 kg (275 lb 1.6 oz) 124.6 kg (274 lb 11.1 oz)     Hospital Course:  The patient was admitted for a planned laparoscopic sleeve gastrectomy. Please see operative note. Preoperatively the patient was given 5000 units of subcutaneous heparin for DVT prophylaxis. Postoperative prophylactic Lovenox dosing was started on the morning of postoperative day 1. On postoperative day 1 had had no fever, no tachycardia and clinically looked well and the patient was started on ice chips and water which they tolerated. On postoperative day 2 The patient's diet was advanced to protein shakes which they also tolerated. She did have some HTN issues on POD 0 and 1 which was treated with prn BP meds but her BP was better on POD 2. The patient was ambulating without difficulty. Their vital signs are stable without fever or tachycardia. Their hemoglobin had remained stable. The patient had received discharge instructions and counseling. They were deemed stable for discharge.   Discharge Instructions  Discharge Instructions     Ambulate hourly while awake    Complete by:  As directed      Call MD for:  difficulty breathing, headache or visual disturbances    Complete by:  As directed      Call MD for:  persistant dizziness or light-headedness    Complete by:  As directed      Call MD for:  persistant nausea and vomiting    Complete by:  As directed      Call MD for:  redness, tenderness, or signs of infection (pain, swelling, redness, odor or green/yellow discharge around incision site)    Complete by:  As directed      Call MD for:  severe uncontrolled pain    Complete by:  As directed      Call MD for:  temperature >101 F    Complete by:  As directed      Diet bariatric full liquid    Complete by:  As directed      Discharge instructions    Complete by:  As directed   See bariatric discharge instructions     Incentive spirometry    Complete by:  As directed   Perform hourly while awake            Medication List    TAKE these medications        atorvastatin 10 MG tablet  Commonly known as:  LIPITOR  Take 10 mg by mouth daily.     meclizine 25 MG tablet  Commonly known as:  ANTIVERT  Take 25 mg by mouth 3 (three) times daily as needed for dizziness.     Melatonin 3 MG  Tabs  Take 1 tablet (3 mg total) by mouth 1 day or 1 dose.     oxyCODONE 5 MG/5ML solution  Commonly known as:  ROXICODONE  Take 5-10 mLs (5-10 mg total) by mouth every 4 (four) hours as needed for moderate pain or severe pain.     SYSTANE OP  Place 1 drop into both eyes daily as needed (for dry eyes).     valACYclovir 500 MG tablet  Commonly known as:  VALTREX  Take 1 tablet (500 mg total) by mouth daily.  Notes to Patient:  This medication may be too large to swallow, if it cannot be cut in half, speak to prescribing doctor about changing to liquid formula     zaleplon 10 MG capsule  Commonly known as:  SONATA  Take 1 capsule (10 mg total) by mouth at bedtime as needed for sleep.           Follow-up Information     Follow up with Gayland Curry, MD. Go on 11/02/2015.   Specialty:  General Surgery   Why:  For Post-Op Check at 8:45AM   Contact information:   [place] [address] [phone]        The results of significant diagnostics from this hospitalization (including imaging, microbiology, ancillary and laboratory) are listed below for reference.    Significant Diagnostic Studies: No results found.  Labs: Basic Metabolic Panel:  Recent Labs Lab 10/14/15 0835 10/19/15 0433  NA 144 140  K 4.2 4.3  CL 107 105  CO2 25 25  GLUCOSE 105* 132*  BUN 15 11  CREATININE 0.93 0.87  CALCIUM 9.4 9.4   Liver Function Tests:  Recent Labs Lab 10/14/15 0835 10/19/15 0433  AST 34 60*  ALT 34 62*  ALKPHOS 80 72  BILITOT 0.7 0.5  PROT 8.0 8.1  ALBUMIN 4.2 4.2    CBC:  Recent Labs Lab 10/14/15 0835 10/18/15 1019 10/19/15 0433 10/19/15 1609 10/20/15 0432  WBC 4.2  --  8.8  --  6.5  NEUTROABS 2.0  --  6.9  --  4.2  HGB 11.4* 10.5* 11.6* 11.6* 11.7*  HCT 34.4* 32.2* 34.9* 34.9* 36.4  MCV 89.1  --  84.9  --  88.8  PLT 281  --  269  --  246    CBG:  Recent Labs Lab 10/18/15 0529  GLUCAP 92    Active Problems:   S/P laparoscopic sleeve gastrectomy   Time coordinating discharge: 10  Minutes  Signed:  Gayland Curry, MD [facility], [place] [phone] 10/20/2015, 2:26 PM

## 2015-10-31 ENCOUNTER — Telehealth (HOSPITAL_COMMUNITY): Payer: Self-pay

## 2015-10-31 NOTE — Telephone Encounter (Signed)
Attempted DC call, no answer, left message to return call   Made discharge phone call to patient per DROP protocol. Asking the following questions.    1. Do you have someone to care for you now that you are home?   2. Are you having pain now that is not relieved by your pain medication?   3. Are you able to drink the recommended daily amount of fluids (48 ounces minimum/day) and protein (60-80 grams/day) as prescribed by the dietitian or nutritional counselor?   4. Are you taking the vitamins and minerals as prescribed?   5. Do you have the "on call" number to contact your surgeon if you have a problem or question?   6. Are your incisions free of redness, swelling or drainage? (If steri strips, address that these can fall off, shower as tolerated)  7. Have your bowels moved since your surgery?  If not, are you passing gas?   8. Are you up and walking 3-4 times per day?

## 2015-11-01 ENCOUNTER — Ambulatory Visit: Payer: 59

## 2015-11-02 DIAGNOSIS — E784 Other hyperlipidemia: Secondary | ICD-10-CM | POA: Diagnosis not present

## 2015-11-02 DIAGNOSIS — R8290 Unspecified abnormal findings in urine: Secondary | ICD-10-CM | POA: Diagnosis not present

## 2015-11-08 ENCOUNTER — Other Ambulatory Visit: Payer: Self-pay | Admitting: Internal Medicine

## 2015-11-08 DIAGNOSIS — D126 Benign neoplasm of colon, unspecified: Secondary | ICD-10-CM | POA: Diagnosis not present

## 2015-11-08 DIAGNOSIS — E784 Other hyperlipidemia: Secondary | ICD-10-CM | POA: Diagnosis not present

## 2015-11-08 DIAGNOSIS — G4700 Insomnia, unspecified: Secondary | ICD-10-CM | POA: Diagnosis not present

## 2015-11-08 DIAGNOSIS — K5900 Constipation, unspecified: Secondary | ICD-10-CM | POA: Diagnosis not present

## 2015-11-08 DIAGNOSIS — Z1231 Encounter for screening mammogram for malignant neoplasm of breast: Secondary | ICD-10-CM

## 2015-11-08 DIAGNOSIS — Z6830 Body mass index (BMI) 30.0-30.9, adult: Secondary | ICD-10-CM | POA: Diagnosis not present

## 2015-11-08 DIAGNOSIS — E669 Obesity, unspecified: Secondary | ICD-10-CM | POA: Diagnosis not present

## 2015-11-08 DIAGNOSIS — Z0000 Encounter for general adult medical examination without abnormal findings: Secondary | ICD-10-CM | POA: Diagnosis not present

## 2015-11-08 DIAGNOSIS — Z1389 Encounter for screening for other disorder: Secondary | ICD-10-CM | POA: Diagnosis not present

## 2015-11-08 DIAGNOSIS — D649 Anemia, unspecified: Secondary | ICD-10-CM | POA: Diagnosis not present

## 2015-11-11 ENCOUNTER — Ambulatory Visit
Admission: RE | Admit: 2015-11-11 | Discharge: 2015-11-11 | Disposition: A | Discharge status: Home / Self Care | Payer: 59 | Source: Ambulatory Visit | Attending: Internal Medicine | Admitting: Internal Medicine

## 2015-11-11 DIAGNOSIS — Z1231 Encounter for screening mammogram for malignant neoplasm of breast: Secondary | ICD-10-CM

## 2015-11-15 ENCOUNTER — Other Ambulatory Visit: Payer: Self-pay | Admitting: Internal Medicine

## 2015-11-15 DIAGNOSIS — R928 Other abnormal and inconclusive findings on diagnostic imaging of breast: Secondary | ICD-10-CM

## 2015-11-18 ENCOUNTER — Ambulatory Visit
Admission: RE | Admit: 2015-11-18 | Discharge: 2015-11-18 | Disposition: A | Discharge status: Home / Self Care | Payer: 59 | Source: Ambulatory Visit | Attending: Internal Medicine | Admitting: Internal Medicine

## 2015-11-18 ENCOUNTER — Ambulatory Visit: Payer: 59

## 2015-11-18 DIAGNOSIS — R928 Other abnormal and inconclusive findings on diagnostic imaging of breast: Secondary | ICD-10-CM

## 2015-11-18 DIAGNOSIS — N6082 Other benign mammary dysplasias of left breast: Secondary | ICD-10-CM | POA: Diagnosis not present

## 2015-11-24 ENCOUNTER — Encounter: Payer: Self-pay | Admitting: Obstetrics & Gynecology

## 2015-11-24 ENCOUNTER — Ambulatory Visit (INDEPENDENT_AMBULATORY_CARE_PROVIDER_SITE_OTHER): Payer: 59 | Admitting: Obstetrics & Gynecology

## 2015-11-24 VITALS — BP 118/74 | HR 60 | Resp 16 | Ht 73.0 in | Wt 265.0 lb

## 2015-11-24 DIAGNOSIS — Z01419 Encounter for gynecological examination (general) (routine) without abnormal findings: Secondary | ICD-10-CM | POA: Diagnosis not present

## 2015-11-24 DIAGNOSIS — R3129 Other microscopic hematuria: Secondary | ICD-10-CM | POA: Diagnosis not present
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
## 2015-11-25 LAB — URINALYSIS, MICROSCOPIC ONLY
CRYSTALS: NONE SEEN [HPF]
YEAST: NONE SEEN [HPF]

## 2015-11-25 NOTE — Addendum Note (Signed)
Addended by: Megan Salon on: 11/25/2015 07:19 AM   Modules accepted: Miquel Dunn

## 2015-11-28 ENCOUNTER — Telehealth: Payer: Self-pay | Admitting: Emergency Medicine

## 2015-11-28 NOTE — Telephone Encounter (Signed)
-----   Message from Megan Salon, MD sent at 11/25/2015  6:12 PM EDT ----- Regarding: RE: Mammogram hold  Yes.  Ok to remove from hold.  MSM ----- Message -----    From: Michele Mcalpine, RN    Sent: 11/25/2015  10:58 AM      To: Megan Salon, MD Subject: Mammogram hold                                 Dr. Sabra Heck,  Patient in mammogram hold, recall from screening. Results went to PCP. Okay to remove from hold?

## 2015-12-01 ENCOUNTER — Emergency Department (HOSPITAL_COMMUNITY)
Admission: EM | Admit: 2015-12-01 | Discharge: 2015-12-01 | Disposition: A | Discharge status: Home / Self Care | Payer: 59 | Attending: Emergency Medicine | Admitting: Emergency Medicine

## 2015-12-01 ENCOUNTER — Encounter (HOSPITAL_COMMUNITY): Payer: Self-pay | Admitting: Emergency Medicine

## 2015-12-01 DIAGNOSIS — Z8619 Personal history of other infectious and parasitic diseases: Secondary | ICD-10-CM | POA: Insufficient documentation

## 2015-12-01 DIAGNOSIS — T781XXA Other adverse food reactions, not elsewhere classified, initial encounter: Secondary | ICD-10-CM | POA: Diagnosis present

## 2015-12-01 DIAGNOSIS — T7840XA Allergy, unspecified, initial encounter: Secondary | ICD-10-CM

## 2015-12-01 DIAGNOSIS — E7800 Pure hypercholesterolemia, unspecified: Secondary | ICD-10-CM | POA: Insufficient documentation

## 2015-12-01 DIAGNOSIS — J45909 Unspecified asthma, uncomplicated: Secondary | ICD-10-CM | POA: Insufficient documentation

## 2015-12-01 DIAGNOSIS — M1990 Unspecified osteoarthritis, unspecified site: Secondary | ICD-10-CM | POA: Diagnosis not present

## 2015-12-01 DIAGNOSIS — Z79899 Other long term (current) drug therapy: Secondary | ICD-10-CM | POA: Insufficient documentation

## 2015-12-01 DIAGNOSIS — L509 Urticaria, unspecified: Secondary | ICD-10-CM

## 2015-12-01 DIAGNOSIS — L272 Dermatitis due to ingested food: Secondary | ICD-10-CM | POA: Insufficient documentation

## 2015-12-01 DIAGNOSIS — L500 Allergic urticaria: Secondary | ICD-10-CM | POA: Diagnosis not present

## 2015-12-01 MED ORDER — FAMOTIDINE IN NACL 20-0.9 MG/50ML-% IV SOLN
20.0000 mg | Freq: Once | INTRAVENOUS | Status: AC
  Administered 2015-12-01: 20 mg via INTRAVENOUS
  Filled 2015-12-01: qty 50

## 2015-12-01 MED ORDER — DIPHENHYDRAMINE HCL 50 MG/ML IJ SOLN
25.0000 mg | Freq: Once | INTRAMUSCULAR | Status: AC
  Administered 2015-12-01: 25 mg via INTRAVENOUS
  Filled 2015-12-01: qty 1

## 2015-12-01 MED ORDER — EPINEPHRINE 0.3 MG/0.3ML IJ SOAJ: 0.3000 mg | Freq: Once | INTRAMUSCULAR | Status: AC

## 2015-12-01 MED ORDER — METHYLPREDNISOLONE SODIUM SUCC 125 MG IJ SOLR
125.0000 mg | Freq: Once | INTRAMUSCULAR | Status: AC
  Administered 2015-12-01: 125 mg via INTRAVENOUS
  Filled 2015-12-01: qty 2

## 2015-12-01 NOTE — ED Notes (Signed)
MD at bedside. 

## 2015-12-01 NOTE — ED Notes (Signed)
PTS HIVES HAVE SIGNIFICANTLY IMPROVED. DR. Ashok Cordia MADE AWARE.

## 2015-12-01 NOTE — Discharge Instructions (Signed)
It was our pleasure to provide your ER care today - we hope that you feel better.  Take benadryl 25-50 mg every 6 hours as need - may make drowsy, no driving for the next 4 hours or when taking benadryl.  In event of future severe allergic reaction (i.e. Throat closing, unable to breath, or about to faint) - use epipen, take benadryl 50 mg, and seek medical attention right away.   Avoid shrimp - follow up with your doctor and allergist to attempt to clarify your allergies.  Follow up with your doctor/allergist in the coming week.  Return to ER right away if worse, symptoms recur, trouble breathing, other concern.     Food Allergy A food allergy is an abnormal reaction to a food (food allergen) by the body's defense system (immune system). Foods that commonly cause allergies are:  Milk.  Seafood.  Eggs.  Nuts.  Wheat.  Soy. CAUSES Food allergies happen when the immune system mistakenly sees a food as harmful and releases antibodies to fight it. SIGNS AND SYMPTOMS Symptoms may be mild or severe. They usually start minutes after the food is eaten, but they can occur even a few hours later. In people with a severe allergy, symptoms can start within seconds. Mild Symptoms  Nasal congestion.  Tingling in the mouth.  An itchy, red rash.  Vomiting.  Diarrhea. Severe Symptoms  Swelling of the lips, face, and tongue.  Swelling of the back of the mouth and throat.  Wheezing.  A hoarse voice.  Itchy, red, swollen areas of skin (hives).  Dizziness or light-headedness.  Fainting.  Trouble breathing, speaking, or swallowing.  Chest tightness.  Rapid heartbeat. DIAGNOSIS A diagnosis is made with a physical exam, medical and family history, and one or more of the following:  Skin tests.  Blood tests.  A food diary.  The results of an elimination diet. The elimination diet involves removing foods from your diet and then adding them back in, one at a  time. TREATMENT There is no cure for allergies. An allergic reaction can be treated with medicines, such as:  Antihistamines.  Steroids.  Respiratory inhalers.  Epinephrine. Severe symptoms can be a sign of a life-threatening reaction called anaphylaxis, and they require immediate treatment. Severe reactions usually need to be treated at a hospital. People who have had a severe reaction may be prescribed rescue medicines to take if they are accidentally exposed to an allergen. HOME CARE INSTRUCTIONS General Instructions  Avoid the foods that you are allergic to.  Read food labels before you eat packaged items. Look for ingredients you are allergic to.  When you are at a restaurant, tell your server that you have an allergy. If you are unsure of whether a meal has an ingredient that you are allergic to, ask your server.  Take medicines only as directed by your health care provider. Do not drive until the medicine has worn off, unless your health care provider gives you approval.  Inform all health care providers that you have a food allergy.  Contact your health care provider if you want to be tested for an allergy. If you have had an anaphylactic reaction before, you should never test yourself for an allergy without your health care provider's approval. Instructions for People with Severe Allergies  Wear a medical alert bracelet or necklace that describes your allergy.  Carry your anaphylaxis kit or an epinephrine injection with you at all times. Use them as directed by your health care  provider.  Make sure that you, your family members, and your employer know:  How to use an anaphylaxis kit.  How to give an epinephrine injection.  Replace your epinephrine immediately after use, in case you have another reaction.  Seek medical care even after you take epinephrine. This is important because epinephrine can be followed by a delayed, life-threatening reaction. Instructions for  People with a Potential Allergy  Follow the elimination diet as directed by your health care provider.  Keep a food diary as directed by your health care provider. Every day, write down:  What you eat and drink and when.  What symptoms you have and when. SEEK MEDICAL CARE IF:  Your symptoms have not gone away within 2 days.  Your symptoms get worse.  You develop new symptoms. SEEK IMMEDIATE MEDICAL CARE IF:  You use epinephrine.  You are having a severe allergic reaction. Symptoms of a severe reaction include:  Swelling of the lips, face, and tongue.  Swelling of the back of the mouth and throat.  Wheezing.  A hoarse voice.  Hives.  Dizziness or light-headedness.  Fainting.  Trouble breathing, speaking, or swallowing.  Chest tightness.  Rapid heartbeat.   This information is not intended to replace advice given to you by your health care provider. Make sure you discuss any questions you have with your health care provider.   Document Released: 07/20/2000 Document Revised: 08/13/2014 Document Reviewed: 05/04/2014 Elsevier Interactive Patient Education 2016 [employer].    Anaphylactic Reaction An anaphylactic reaction is a sudden, severe allergic reaction. It affects the whole body. It can be life threatening. You may need to stay in the hospital.  [place] a medical bracelet or necklace that lists your allergy.  Carry your allergy kit or medicine shot to treat severe allergic reactions with you. These can save your life.  Do not drive until medicine from your shot has worn off, unless your doctor says it is okay.  If you have hives or a rash:  Take medicine as told by your doctor.  You may take over-the-counter antihistamine medicine.  Place cold cloths on your skin. Take baths in cool water. Avoid hot baths and hot showers. GET HELP RIGHT AWAY IF:   Your mouth is puffy (swollen), or you have trouble breathing.  You start making whistling  sounds when you breathe (wheezing).  You have a tight feeling in your chest or throat.  You have a rash, hives, puffiness, or itching on your body.  You throw up (vomit) or have watery poop (diarrhea).  You feel dizzy or pass out (faint).  You think you are having an allergic reaction.  You have new symptoms. This is an emergency. Use your medicine shot or allergy kit as told. Call your local emergency services (911 in [place] Even if you feel better after the shot, you need to go to the hospital emergency department. MAKE SURE YOU:   Understand these instructions.  Will watch your condition.  Will get help right away if you are not doing well or get worse.   This information is not intended to replace advice given to you by your health care provider. Make sure you discuss any questions you have with your health care provider.   Document Released: 01/09/2008 Document Revised: 01/22/2012 Document Reviewed: 02/02/2015 Elsevier Interactive Patient Education [employer].
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
## 2015-12-01 NOTE — ED Notes (Signed)
Pt states she ate shrimp about 430pm and had a sudden onset of hives all over. Pt has hives to face and whole upper torso. Airway intact. Breath sounds clear. Swelling in noted to lower lip. Pt able to speak clearly, reports no difficultly swallowing of sob.

## 2016-01-25 MED FILL — PANTOPRAZOLE SOD DR 40 MG T: 40 | 90 days supply | Qty: 90 | Fill #0

## 2016-01-25 MED FILL — LINZESS 145 MCG CAPSULE: 145 | 30 days supply | Qty: 30 | Fill #0

## 2016-02-01 DIAGNOSIS — E669 Obesity, unspecified: Secondary | ICD-10-CM | POA: Diagnosis not present

## 2016-02-01 DIAGNOSIS — G4700 Insomnia, unspecified: Secondary | ICD-10-CM | POA: Diagnosis not present

## 2016-02-01 DIAGNOSIS — K5900 Constipation, unspecified: Secondary | ICD-10-CM | POA: Diagnosis not present

## 2016-02-01 DIAGNOSIS — R3121 Asymptomatic microscopic hematuria: Secondary | ICD-10-CM | POA: Diagnosis not present

## 2016-02-01 DIAGNOSIS — D6489 Other specified anemias: Secondary | ICD-10-CM | POA: Diagnosis not present

## 2016-02-01 DIAGNOSIS — E784 Other hyperlipidemia: Secondary | ICD-10-CM | POA: Diagnosis not present

## 2016-03-02 MED FILL — DESONIDE 0.05% CREAM: 0.05 | 15 days supply | Qty: 30 | Fill #0

## 2016-03-02 MED FILL — BELSOMRA 20 MG TABLET: 20 | 30 days supply | Qty: 30 | Fill #0

## 2016-04-10 MED FILL — BELSOMRA 20 MG TABLET: 20 | 30 days supply | Qty: 30 | Fill #1

## 2016-04-24 DIAGNOSIS — Z9884 Bariatric surgery status: Secondary | ICD-10-CM | POA: Diagnosis not present

## 2016-04-24 DIAGNOSIS — E7800 Pure hypercholesterolemia, unspecified: Secondary | ICD-10-CM | POA: Diagnosis not present

## 2016-04-24 DIAGNOSIS — M170 Bilateral primary osteoarthritis of knee: Secondary | ICD-10-CM | POA: Diagnosis not present

## 2016-04-24 DIAGNOSIS — R7303 Prediabetes: Secondary | ICD-10-CM | POA: Diagnosis not present

## 2016-04-24 DIAGNOSIS — K5900 Constipation, unspecified: Secondary | ICD-10-CM | POA: Diagnosis not present

## 2016-04-24 DIAGNOSIS — K912 Postsurgical malabsorption, not elsewhere classified: Secondary | ICD-10-CM | POA: Diagnosis not present

## 2016-04-24 MED FILL — LINZESS 145 MCG CAPSULE: 145 | 30 days supply | Qty: 30 | Fill #0

## 2016-05-10 ENCOUNTER — Encounter: Payer: 59 | Attending: General Surgery | Admitting: Dietician

## 2016-05-10 DIAGNOSIS — Z713 Dietary counseling and surveillance: Secondary | ICD-10-CM | POA: Insufficient documentation

## 2016-05-10 DIAGNOSIS — Z9884 Bariatric surgery status: Secondary | ICD-10-CM

## 2016-05-10 NOTE — Progress Notes (Signed)
  Follow-up visit:  7 Months  Post-Operative Sleeve Surgery  Medical Nutrition Therapy:  Appt start time: [id number] end time:  [id number].  Primary concerns today: Post-operative Bariatric Surgery Nutrition Management. Returns with a 54 lb weight loss since surgery. Doesn't tolerate bread but everything else is ok. Has constipation and taking Linzess for past month and a half and it is helping.  Works nights and gets off 730 AM per week 3 x week. Not meeting her protein or fluid needs. MVI does not contain iron. Patient agrees to try bariatric specific vitamins.  Surgery date: 10/18/2015 Surgery type: Sleeve gastrectomy Start weight at [facility]: 265 lbs on 02/15/15, 272 lbs 07/04/2015 Weight today: 218.0 lbs  Weight change: 54 lbs since 07/04/15  Would like to get weight down to 185 lbs  Preferred Learning Style:   No preference indicated   Learning Readiness:   Ready  24-hr recall: Work day B (AM): none Snk (AM): none  L (330 PM): 2 oz steak with 6 french fries (14 g) Snk (8 PM): popcorn, peanut butter on ritz crackers  D (1 AM): 1.5 oz steak (10 g) Snk (PM): none  24-hr recall: Non work day B (AM): none Snk (AM): none  L (12 PM): chicken bowl from chipotle (10-14 g) Snk (PM): popcorn, peanut butter on ritz crackers   D (630PM): 2 oz pork chop or chicken with veggies (14 g) Snk (PM): none or dessert 1 x week  Fluid intake: 3 regular sodas per week, 3-4 [place] and vodka drinks per week, small amount of water each day  Estimated total protein intake: ~28 grams  Medications: see list  Supplementation: gummy multivitamins, calcium mini 5 per day, and Vitamin B12  Using straws: Yes Drinking while eating: No, does not wait 30 minutes after to drink Hair loss: got a little thinner  Carbonated beverages:  Yes  N/V/D/C: some constipation and taking Linzess Dumping syndrome: No  Recent physical activity:  Walking 4 x week (started back a few weeks ago) for about 60 minutes  Progress  Towards Goal(s):  In progress.  Handouts given during visit include:  Phase 3A High Protein and Phase 3B High Protein + Non Starchy Vegetables   Nutritional Diagnosis:  [place]-3.3 Overweight/obesity related to past poor dietary habits and physical inactivity as evidenced by patient w/ recent sleeve gastrectomy surgery following dietary guidelines for continued weight loss.    Intervention:  Nutrition counseling provided. Goals:  Follow Phase 3B: High Protein + Non-Starchy Vegetables  Eat 3-6 small meals/snacks, every 3-5 hrs  Increase lean protein foods to meet 60g goal  Increase fluid intake to 64oz   Avoid drinking 15 minutes before, during and 30 minutes after eating  Aim for >30 min of physical activity daily  Eat protein and vegetables for the most part  Avoid extra carbs to help with weight loss (popcorn, crackers, sweets, soda)  Try taking Bariatric Advantage multivitmins (contains B12) https://www1.bariatricadvantage.com/catalog/us-en/1/Multivitamins  Try getting in at least one Premier Protein per day  Teaching Method Utilized:  Visual Auditory Hands on  Barriers to learning/adherence to lifestyle change: none  Demonstrated degree of understanding via:  Teach Back   Monitoring/Evaluation:  Dietary intake, exercise, and body weight. Follow up in 1 months for 8 month post-op visit.

## 2016-05-10 NOTE — Patient Instructions (Addendum)
Goals:  Follow Phase 3B: High Protein + Non-Starchy Vegetables  Eat 3-6 small meals/snacks, every 3-5 hrs  Increase lean protein foods to meet 60g goal  Increase fluid intake to 64oz   Avoid drinking 15 minutes before, during and 30 minutes after eating  Aim for >30 min of physical activity daily  Eat protein and vegetables for the most part  Avoid extra carbs to help with weight loss (popcorn, crackers, sweets, soda)  Try taking Bariatric Advantage multivitmins (contains B12) https://www1.bariatricadvantage.com/catalog/us-en/1/Multivitamins  Try getting in at least one Premier Protein per day

## 2016-05-18 MED FILL — BELSOMRA 20 MG TABLET: 20 | 30 days supply | Qty: 30 | Fill #2

## 2016-06-13 ENCOUNTER — Ambulatory Visit: Payer: 59 | Admitting: Dietician

## 2016-06-29 MED FILL — BELSOMRA 20 MG TABLET: 20 | 30 days supply | Qty: 30 | Fill #3

## 2016-07-25 ENCOUNTER — Ambulatory Visit: Payer: 59 | Admitting: Dietician

## 2016-07-25 DIAGNOSIS — E784 Other hyperlipidemia: Secondary | ICD-10-CM | POA: Diagnosis not present

## 2016-07-25 DIAGNOSIS — E669 Obesity, unspecified: Secondary | ICD-10-CM | POA: Diagnosis not present

## 2016-07-25 DIAGNOSIS — K5900 Constipation, unspecified: Secondary | ICD-10-CM | POA: Diagnosis not present

## 2016-07-25 DIAGNOSIS — D649 Anemia, unspecified: Secondary | ICD-10-CM | POA: Diagnosis not present

## 2016-07-25 DIAGNOSIS — B001 Herpesviral vesicular dermatitis: Secondary | ICD-10-CM | POA: Diagnosis not present

## 2016-07-25 DIAGNOSIS — G4700 Insomnia, unspecified: Secondary | ICD-10-CM | POA: Diagnosis not present

## 2016-07-25 MED FILL — ACYCLOVIR 5% OINTMENT: 5 | 15 days supply | Qty: 15 | Fill #0

## 2016-07-26 MED FILL — BELSOMRA 20 MG TABLET: 20 | 30 days supply | Qty: 30 | Fill #4

## 2016-08-07 ENCOUNTER — Ambulatory Visit: Payer: 59 | Admitting: Dietician

## 2016-08-13 DIAGNOSIS — H524 Presbyopia: Secondary | ICD-10-CM | POA: Diagnosis not present

## 2016-09-13 MED FILL — LINZESS 145 MCG CAPSULE: 145 | 30 days supply | Qty: 30 | Fill #1

## 2016-09-18 MED FILL — BELSOMRA 20 MG TABLET: 20 | 30 days supply | Qty: 30 | Fill #0

## 2016-09-22 DIAGNOSIS — F329 Major depressive disorder, single episode, unspecified: Secondary | ICD-10-CM | POA: Diagnosis not present

## 2016-09-28 DIAGNOSIS — R7309 Other abnormal glucose: Secondary | ICD-10-CM | POA: Diagnosis not present

## 2016-09-28 DIAGNOSIS — E7800 Pure hypercholesterolemia, unspecified: Secondary | ICD-10-CM | POA: Diagnosis not present

## 2016-09-28 DIAGNOSIS — R7303 Prediabetes: Secondary | ICD-10-CM | POA: Diagnosis not present

## 2016-09-28 DIAGNOSIS — K912 Postsurgical malabsorption, not elsewhere classified: Secondary | ICD-10-CM | POA: Diagnosis not present

## 2016-09-29 DIAGNOSIS — F329 Major depressive disorder, single episode, unspecified: Secondary | ICD-10-CM | POA: Diagnosis not present

## 2016-10-04 DIAGNOSIS — Z9884 Bariatric surgery status: Secondary | ICD-10-CM | POA: Diagnosis not present

## 2016-10-04 DIAGNOSIS — M170 Bilateral primary osteoarthritis of knee: Secondary | ICD-10-CM | POA: Diagnosis not present

## 2016-10-04 DIAGNOSIS — E7800 Pure hypercholesterolemia, unspecified: Secondary | ICD-10-CM | POA: Diagnosis not present

## 2016-10-13 DIAGNOSIS — F3289 Other specified depressive episodes: Secondary | ICD-10-CM | POA: Diagnosis not present

## 2016-10-23 MED FILL — BELSOMRA 20 MG TABLET: 20 | 30 days supply | Qty: 30 | Fill #1

## 2016-10-23 MED FILL — PANTOPRAZOLE SOD DR 40 MG T: 40 | 30 days supply | Qty: 30 | Fill #1

## 2016-10-27 DIAGNOSIS — F3289 Other specified depressive episodes: Secondary | ICD-10-CM | POA: Diagnosis not present

## 2016-11-13 DIAGNOSIS — E784 Other hyperlipidemia: Secondary | ICD-10-CM | POA: Diagnosis not present

## 2016-11-13 DIAGNOSIS — K219 Gastro-esophageal reflux disease without esophagitis: Secondary | ICD-10-CM | POA: Diagnosis not present

## 2016-11-13 DIAGNOSIS — G4726 Circadian rhythm sleep disorder, shift work type: Secondary | ICD-10-CM | POA: Diagnosis not present

## 2016-11-13 DIAGNOSIS — D126 Benign neoplasm of colon, unspecified: Secondary | ICD-10-CM | POA: Diagnosis not present

## 2016-11-13 DIAGNOSIS — Z1389 Encounter for screening for other disorder: Secondary | ICD-10-CM | POA: Diagnosis not present

## 2016-11-13 DIAGNOSIS — E669 Obesity, unspecified: Secondary | ICD-10-CM | POA: Diagnosis not present

## 2016-11-13 DIAGNOSIS — K5900 Constipation, unspecified: Secondary | ICD-10-CM | POA: Diagnosis not present

## 2016-11-13 DIAGNOSIS — J302 Other seasonal allergic rhinitis: Secondary | ICD-10-CM | POA: Diagnosis not present

## 2016-11-13 DIAGNOSIS — Z0000 Encounter for general adult medical examination without abnormal findings: Secondary | ICD-10-CM | POA: Diagnosis not present

## 2016-11-13 DIAGNOSIS — M1990 Unspecified osteoarthritis, unspecified site: Secondary | ICD-10-CM | POA: Diagnosis not present

## 2016-12-07 ENCOUNTER — Other Ambulatory Visit (HOSPITAL_COMMUNITY)
Admission: RE | Admit: 2016-12-07 | Discharge: 2016-12-07 | Disposition: A | Discharge status: Home / Self Care | Payer: 59 | Source: Ambulatory Visit | Attending: Obstetrics & Gynecology | Admitting: Obstetrics & Gynecology

## 2016-12-07 ENCOUNTER — Encounter: Payer: Self-pay | Admitting: Obstetrics & Gynecology

## 2016-12-07 ENCOUNTER — Ambulatory Visit (INDEPENDENT_AMBULATORY_CARE_PROVIDER_SITE_OTHER): Payer: 59 | Admitting: Obstetrics & Gynecology

## 2016-12-07 VITALS — BP 120/82 | HR 60 | Resp 12 | Ht 71.0 in | Wt 221.4 lb

## 2016-12-07 DIAGNOSIS — Z01419 Encounter for gynecological examination (general) (routine) without abnormal findings: Secondary | ICD-10-CM | POA: Diagnosis not present

## 2016-12-07 DIAGNOSIS — Z124 Encounter for screening for malignant neoplasm of cervix: Secondary | ICD-10-CM | POA: Diagnosis not present

## 2016-12-07 MED FILL — BELSOMRA 20 MG TABLET: 20 | 30 days supply | Qty: 30 | Fill #2
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
## 2016-12-10 LAB — CYTOLOGY - PAP: Diagnosis: NEGATIVE

## 2017-01-14 MED FILL — BELSOMRA 20 MG TABLET: 20 | 30 days supply | Qty: 30 | Fill #3

## 2017-01-24 MED FILL — MELOXICAM 15 MG TABLET: 15 | 30 days supply | Qty: 30 | Fill #0

## 2017-02-01 MED FILL — PANTOPRAZOLE SOD DR 40 MG T: 40 | 30 days supply | Qty: 30 | Fill #0

## 2017-02-13 MED FILL — BELSOMRA 20 MG TABLET: 20 | 30 days supply | Qty: 30 | Fill #4

## 2017-02-15 MED FILL — LINZESS 145 MCG CAPSULE: 145 | 30 days supply | Qty: 30 | Fill #2

## 2017-03-07 ENCOUNTER — Ambulatory Visit: Payer: 59 | Admitting: Obstetrics & Gynecology

## 2017-03-21 MED FILL — PANTOPRAZOLE SOD DR 40 MG T: 40 | 90 days supply | Qty: 90 | Fill #1

## 2017-03-28 MED FILL — BELSOMRA 20 MG TABLET: 20 | 30 days supply | Qty: 30 | Fill #0

## 2017-05-07 MED FILL — BELSOMRA 20 MG TABLET: 20 | 30 days supply | Qty: 30 | Fill #1

## 2017-05-16 ENCOUNTER — Other Ambulatory Visit: Payer: Self-pay | Admitting: Internal Medicine

## 2017-05-16 DIAGNOSIS — Z01818 Encounter for other preprocedural examination: Secondary | ICD-10-CM | POA: Diagnosis not present

## 2017-05-16 DIAGNOSIS — E7849 Other hyperlipidemia: Secondary | ICD-10-CM | POA: Diagnosis not present

## 2017-05-28 DIAGNOSIS — F909 Attention-deficit hyperactivity disorder, unspecified type: Secondary | ICD-10-CM | POA: Diagnosis not present

## 2017-05-28 DIAGNOSIS — H8113 Benign paroxysmal vertigo, bilateral: Secondary | ICD-10-CM | POA: Diagnosis not present

## 2017-05-28 DIAGNOSIS — E668 Other obesity: Secondary | ICD-10-CM | POA: Diagnosis not present

## 2017-05-28 DIAGNOSIS — Z6830 Body mass index (BMI) 30.0-30.9, adult: Secondary | ICD-10-CM | POA: Diagnosis not present

## 2017-05-29 ENCOUNTER — Other Ambulatory Visit (HOSPITAL_COMMUNITY): Payer: 59

## 2017-06-06 MED FILL — BELSOMRA 20 MG TABLET: 20 | 30 days supply | Qty: 30 | Fill #2

## 2017-06-20 ENCOUNTER — Encounter: Payer: Self-pay | Admitting: Internal Medicine

## 2017-07-10 MED FILL — LINZESS 145 MCG CAPSULE: 145 | 30 days supply | Qty: 30 | Fill #0

## 2017-07-11 MED FILL — BELSOMRA 20 MG TABLET: 20 | 30 days supply | Qty: 30 | Fill #3

## 2017-08-07 DIAGNOSIS — F909 Attention-deficit hyperactivity disorder, unspecified type: Secondary | ICD-10-CM | POA: Diagnosis not present

## 2017-08-07 MED FILL — PANTOPRAZOLE SOD DR 40 MG T: 40 | 90 days supply | Qty: 90 | Fill #0

## 2017-08-07 MED FILL — DEXTROAMP-AMPHETAMIN 15 MG: 15 | 30 days supply | Qty: 30 | Fill #0

## 2017-08-15 DIAGNOSIS — H524 Presbyopia: Secondary | ICD-10-CM | POA: Diagnosis not present

## 2017-08-26 MED FILL — BELSOMRA 20 MG TABLET: 20 | 30 days supply | Qty: 30 | Fill #4

## 2017-08-29 ENCOUNTER — Ambulatory Visit (INDEPENDENT_AMBULATORY_CARE_PROVIDER_SITE_OTHER): Payer: 59

## 2017-08-29 DIAGNOSIS — Z01818 Encounter for other preprocedural examination: Secondary | ICD-10-CM | POA: Diagnosis not present

## 2017-08-29 LAB — EXERCISE TOLERANCE TEST
CHL CUP MPHR: 165 {beats}/min
CHL RATE OF PERCEIVED EXERTION: 17
CSEPHR: 96 %
Estimated workload: 10 METS
Exercise duration (min): 8 min
Exercise duration (sec): 0 s
Peak HR: 160 {beats}/min
Rest HR: 62 {beats}/min

## 2017-09-06 DIAGNOSIS — Z0000 Encounter for general adult medical examination without abnormal findings: Secondary | ICD-10-CM | POA: Diagnosis not present

## 2017-09-26 ENCOUNTER — Other Ambulatory Visit: Payer: Self-pay | Admitting: Internal Medicine

## 2017-09-26 DIAGNOSIS — Z1231 Encounter for screening mammogram for malignant neoplasm of breast: Secondary | ICD-10-CM

## 2017-09-27 ENCOUNTER — Encounter: Payer: Self-pay | Admitting: Obstetrics & Gynecology

## 2017-09-27 DIAGNOSIS — Z01818 Encounter for other preprocedural examination: Secondary | ICD-10-CM | POA: Diagnosis not present

## 2017-09-27 DIAGNOSIS — Z6831 Body mass index (BMI) 31.0-31.9, adult: Secondary | ICD-10-CM | POA: Diagnosis not present

## 2017-09-30 DIAGNOSIS — L669 Cicatricial alopecia, unspecified: Secondary | ICD-10-CM | POA: Diagnosis not present

## 2017-09-30 DIAGNOSIS — L218 Other seborrheic dermatitis: Secondary | ICD-10-CM | POA: Diagnosis not present

## 2017-09-30 DIAGNOSIS — L659 Nonscarring hair loss, unspecified: Secondary | ICD-10-CM | POA: Diagnosis not present

## 2017-09-30 MED FILL — FLUOCINOLONE 0.01% SCALP OI: 0.01 | 30 days supply | Qty: 118 | Fill #0

## 2017-10-02 MED FILL — BELSOMRA 20 MG TABLET: 20 | 30 days supply | Qty: 30 | Fill #0

## 2017-10-15 ENCOUNTER — Ambulatory Visit
Admission: RE | Admit: 2017-10-15 | Discharge: 2017-10-15 | Disposition: A | Discharge status: Home / Self Care | Payer: 59 | Source: Ambulatory Visit | Attending: Internal Medicine | Admitting: Internal Medicine

## 2017-10-15 DIAGNOSIS — Z1231 Encounter for screening mammogram for malignant neoplasm of breast: Secondary | ICD-10-CM | POA: Diagnosis not present

## 2017-11-04 HISTORY — PX: BUTTOCK LIFT: SHX1278

## 2017-11-04 HISTORY — PX: LIPOSUCTION: SHX10

## 2017-11-07 MED FILL — LINZESS 145 MCG CAPSULE: 145 | 30 days supply | Qty: 30 | Fill #1

## 2017-11-07 MED FILL — BELSOMRA 20 MG TABLET: 20 | 30 days supply | Qty: 30 | Fill #1

## 2017-12-09 DIAGNOSIS — L668 Other cicatricial alopecia: Secondary | ICD-10-CM | POA: Diagnosis not present

## 2017-12-16 ENCOUNTER — Other Ambulatory Visit: Payer: Self-pay

## 2017-12-16 ENCOUNTER — Encounter: Payer: Self-pay | Admitting: Obstetrics & Gynecology

## 2017-12-16 ENCOUNTER — Ambulatory Visit: Payer: 59 | Admitting: Obstetrics & Gynecology

## 2017-12-16 VITALS — BP 132/70 | HR 80 | Resp 16 | Ht 71.25 in | Wt 211.0 lb

## 2017-12-16 DIAGNOSIS — Z01419 Encounter for gynecological examination (general) (routine) without abnormal findings: Secondary | ICD-10-CM | POA: Diagnosis not present

## 2017-12-16 DIAGNOSIS — J45909 Unspecified asthma, uncomplicated: Secondary | ICD-10-CM | POA: Insufficient documentation

## 2017-12-16 DIAGNOSIS — J4520 Mild intermittent asthma, uncomplicated: Secondary | ICD-10-CM

## 2017-12-16 DIAGNOSIS — B009 Herpesviral infection, unspecified: Secondary | ICD-10-CM | POA: Diagnosis not present

## 2017-12-16 MED FILL — BELSOMRA 20 MG TABLET: 20 | 30 days supply | Qty: 30 | Fill #2

## 2017-12-16 MED FILL — FLUOCINOLONE ACETONIDE SCAL: 0.01 | 30 days supply | Qty: 118 | Fill #1
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
## 2017-12-25 ENCOUNTER — Encounter: Payer: Self-pay | Admitting: Obstetrics & Gynecology

## 2017-12-25 DIAGNOSIS — Z0000 Encounter for general adult medical examination without abnormal findings: Secondary | ICD-10-CM | POA: Diagnosis not present

## 2017-12-25 DIAGNOSIS — Z9889 Other specified postprocedural states: Secondary | ICD-10-CM | POA: Diagnosis not present

## 2017-12-25 DIAGNOSIS — R8290 Unspecified abnormal findings in urine: Secondary | ICD-10-CM | POA: Diagnosis not present

## 2017-12-25 DIAGNOSIS — E668 Other obesity: Secondary | ICD-10-CM | POA: Diagnosis not present

## 2017-12-25 DIAGNOSIS — M25561 Pain in right knee: Secondary | ICD-10-CM | POA: Diagnosis not present

## 2017-12-25 DIAGNOSIS — F4321 Adjustment disorder with depressed mood: Secondary | ICD-10-CM | POA: Diagnosis not present

## 2017-12-25 DIAGNOSIS — Z1389 Encounter for screening for other disorder: Secondary | ICD-10-CM | POA: Diagnosis not present

## 2017-12-25 DIAGNOSIS — K219 Gastro-esophageal reflux disease without esophagitis: Secondary | ICD-10-CM | POA: Diagnosis not present

## 2017-12-25 DIAGNOSIS — R3121 Asymptomatic microscopic hematuria: Secondary | ICD-10-CM | POA: Diagnosis not present

## 2017-12-25 DIAGNOSIS — K5909 Other constipation: Secondary | ICD-10-CM | POA: Diagnosis not present

## 2017-12-25 MED FILL — ESCITALOPRAM 5 MG TABLET: 5 | 30 days supply | Qty: 30 | Fill #0

## 2018-01-23 MED FILL — BELSOMRA 20 MG TABLET: 20 | 30 days supply | Qty: 30 | Fill #0

## 2018-01-31 DIAGNOSIS — M1711 Unilateral primary osteoarthritis, right knee: Secondary | ICD-10-CM | POA: Diagnosis not present

## 2018-02-20 ENCOUNTER — Encounter

## 2018-02-20 ENCOUNTER — Ambulatory Visit: Payer: 59 | Admitting: Obstetrics & Gynecology

## 2018-03-07 MED FILL — PANTOPRAZOLE SOD DR 40 MG T: 40 | 90 days supply | Qty: 90 | Fill #0

## 2018-03-11 MED FILL — BELSOMRA 20 MG TABLET: 20 | 30 days supply | Qty: 30 | Fill #0

## 2018-03-20 DIAGNOSIS — F909 Attention-deficit hyperactivity disorder, unspecified type: Secondary | ICD-10-CM | POA: Diagnosis not present

## 2018-03-24 MED FILL — DEXTROAMP-AMPHETAMIN 20 MG: 20 | 30 days supply | Qty: 30 | Fill #0

## 2018-04-17 MED FILL — BELSOMRA 20 MG TABLET: 20 | 30 days supply | Qty: 30 | Fill #1

## 2018-05-26 MED FILL — BELSOMRA 20 MG TABLET: 20 | 30 days supply | Qty: 30 | Fill #0

## 2018-06-05 MED FILL — SULFAMETHOXAZOLE-TMP DS TAB: 800-160 | 7 days supply | Qty: 14 | Fill #0

## 2018-06-27 DIAGNOSIS — F900 Attention-deficit hyperactivity disorder, predominantly inattentive type: Secondary | ICD-10-CM | POA: Diagnosis not present

## 2018-06-27 MED FILL — HYDROXYZINE PAM 50 MG CAP: 50 | 30 days supply | Qty: 30 | Fill #0

## 2018-06-27 MED FILL — AMPHETAMINE SALTS 30 MG TAB: 30 | 30 days supply | Qty: 60 | Fill #0

## 2018-06-27 MED FILL — ESZOPICLONE 2 MG TAB: 2 | 30 days supply | Qty: 30 | Fill #0

## 2018-08-05 MED FILL — ESZOPICLONE 2 MG TAB: 2 | 30 days supply | Qty: 30 | Fill #1

## 2018-08-05 MED FILL — HYDROXYZINE PAM 50 MG CAP: 50 | 30 days supply | Qty: 30 | Fill #1

## 2018-09-09 MED FILL — HYDROXYZINE PAM 50 MG CAP: 50 | 30 days supply | Qty: 30 | Fill #2

## 2018-09-09 MED FILL — PANTOPRAZOLE SOD DR 40 MG T: 40 | 60 days supply | Qty: 60 | Fill #1

## 2018-09-09 MED FILL — ESZOPICLONE 2 MG TAB: 2 | 30 days supply | Qty: 30 | Fill #2

## 2018-10-14 MED FILL — HYDROXYZINE PAM 50 MG CAP: 50 | 30 days supply | Qty: 30 | Fill #3

## 2018-10-14 MED FILL — ESZOPICLONE 2 MG TAB: 2 | 30 days supply | Qty: 30 | Fill #3

## 2018-11-14 MED FILL — HYDROXYZINE PAM 50 MG CAP: 50 | 30 days supply | Qty: 30 | Fill #4

## 2018-11-14 MED FILL — ESZOPICLONE 2 MG TAB: 2 | 30 days supply | Qty: 30 | Fill #4

## 2018-11-14 MED FILL — AMPHETAMINE-DEXTROAMPHETAMI: 30 | 30 days supply | Qty: 60 | Fill #0

## 2018-12-18 MED FILL — HYDROXYZINE PAM 50 MG CAP: 50 | 30 days supply | Qty: 30 | Fill #5

## 2018-12-19 MED FILL — ESZOPICLONE 2 MG TABLET: 2 | 30 days supply | Qty: 30 | Fill #5

## 2018-12-22 DIAGNOSIS — K5900 Constipation, unspecified: Secondary | ICD-10-CM | POA: Diagnosis not present

## 2018-12-22 DIAGNOSIS — K219 Gastro-esophageal reflux disease without esophagitis: Secondary | ICD-10-CM | POA: Diagnosis not present

## 2018-12-22 DIAGNOSIS — Z1211 Encounter for screening for malignant neoplasm of colon: Secondary | ICD-10-CM | POA: Diagnosis not present

## 2018-12-22 MED FILL — SUPREP BOWEL PREP KIT: 17.5-3.13-1 | 1 days supply | Qty: 354 | Fill #0

## 2018-12-24 DIAGNOSIS — N390 Urinary tract infection, site not specified: Secondary | ICD-10-CM | POA: Diagnosis not present

## 2018-12-24 DIAGNOSIS — Z0000 Encounter for general adult medical examination without abnormal findings: Secondary | ICD-10-CM | POA: Diagnosis not present

## 2018-12-24 DIAGNOSIS — E7849 Other hyperlipidemia: Secondary | ICD-10-CM | POA: Diagnosis not present

## 2018-12-24 DIAGNOSIS — R8290 Unspecified abnormal findings in urine: Secondary | ICD-10-CM | POA: Diagnosis not present

## 2018-12-24 DIAGNOSIS — R7989 Other specified abnormal findings of blood chemistry: Secondary | ICD-10-CM | POA: Diagnosis not present

## 2018-12-25 MED FILL — PANTOPRAZOLE SOD DR 40 MG T: 40 | 30 days supply | Qty: 30 | Fill #0

## 2018-12-25 MED FILL — NITROFURANTOIN MONO-MCR 100: 100 | 7 days supply | Qty: 14 | Fill #0

## 2018-12-31 DIAGNOSIS — Z1331 Encounter for screening for depression: Secondary | ICD-10-CM | POA: Diagnosis not present

## 2018-12-31 DIAGNOSIS — D649 Anemia, unspecified: Secondary | ICD-10-CM | POA: Diagnosis not present

## 2018-12-31 DIAGNOSIS — K219 Gastro-esophageal reflux disease without esophagitis: Secondary | ICD-10-CM | POA: Diagnosis not present

## 2018-12-31 DIAGNOSIS — E669 Obesity, unspecified: Secondary | ICD-10-CM | POA: Diagnosis not present

## 2018-12-31 DIAGNOSIS — F4321 Adjustment disorder with depressed mood: Secondary | ICD-10-CM | POA: Diagnosis not present

## 2018-12-31 DIAGNOSIS — Z0000 Encounter for general adult medical examination without abnormal findings: Secondary | ICD-10-CM | POA: Diagnosis not present

## 2018-12-31 DIAGNOSIS — Z1339 Encounter for screening examination for other mental health and behavioral disorders: Secondary | ICD-10-CM | POA: Diagnosis not present

## 2018-12-31 DIAGNOSIS — D126 Benign neoplasm of colon, unspecified: Secondary | ICD-10-CM | POA: Diagnosis not present

## 2018-12-31 DIAGNOSIS — K5900 Constipation, unspecified: Secondary | ICD-10-CM | POA: Diagnosis not present

## 2018-12-31 DIAGNOSIS — N390 Urinary tract infection, site not specified: Secondary | ICD-10-CM | POA: Diagnosis not present

## 2018-12-31 DIAGNOSIS — E785 Hyperlipidemia, unspecified: Secondary | ICD-10-CM | POA: Diagnosis not present

## 2018-12-31 MED FILL — ROSUVASTATIN CALCIUM 10 MG: 10 | 90 days supply | Qty: 90 | Fill #0

## 2019-01-14 ENCOUNTER — Other Ambulatory Visit: Payer: Self-pay | Admitting: Internal Medicine

## 2019-01-14 DIAGNOSIS — Z1231 Encounter for screening mammogram for malignant neoplasm of breast: Secondary | ICD-10-CM

## 2019-01-15 DIAGNOSIS — K5730 Diverticulosis of large intestine without perforation or abscess without bleeding: Secondary | ICD-10-CM | POA: Diagnosis not present

## 2019-01-15 DIAGNOSIS — Z1211 Encounter for screening for malignant neoplasm of colon: Secondary | ICD-10-CM | POA: Diagnosis not present

## 2019-01-21 MED FILL — HYDROXYZINE PAM 50 MG CAP: 50 | 30 days supply | Qty: 30 | Fill #0

## 2019-01-21 MED FILL — ESZOPICLONE 2 MG TABLET: 2 | 30 days supply | Qty: 30 | Fill #0

## 2019-01-21 MED FILL — AMPHETAMINE-DEXTROAMPHETAMI: 30 | 30 days supply | Qty: 60 | Fill #0

## 2019-02-27 MED FILL — HYDROXYZINE PAM 50 MG CAP: 50 | 30 days supply | Qty: 30 | Fill #1

## 2019-02-27 MED FILL — PANTOPRAZOLE SOD DR 40 MG T: 40 | 30 days supply | Qty: 30 | Fill #1

## 2019-03-02 ENCOUNTER — Ambulatory Visit
Admission: RE | Admit: 2019-03-02 | Discharge: 2019-03-02 | Disposition: A | Discharge status: Home / Self Care | Payer: 59 | Source: Ambulatory Visit | Attending: Internal Medicine | Admitting: Internal Medicine

## 2019-03-02 ENCOUNTER — Other Ambulatory Visit: Payer: Self-pay

## 2019-03-02 DIAGNOSIS — Z1231 Encounter for screening mammogram for malignant neoplasm of breast: Secondary | ICD-10-CM | POA: Diagnosis not present

## 2019-03-02 MED FILL — ESZOPICLONE 2 MG TAB: 2 | 30 days supply | Qty: 30 | Fill #1

## 2019-03-03 MED FILL — AMPHETAMINE-DEXTROAMPHETAMI: 30 | 30 days supply | Qty: 60 | Fill #0

## 2019-03-10 ENCOUNTER — Other Ambulatory Visit: Payer: Self-pay

## 2019-03-10 ENCOUNTER — Encounter: Payer: Self-pay | Admitting: Obstetrics & Gynecology

## 2019-03-10 ENCOUNTER — Ambulatory Visit: Payer: 59 | Admitting: Obstetrics & Gynecology

## 2019-03-10 VITALS — BP 116/70 | HR 72 | Temp 97.0°F | Ht 71.0 in | Wt 223.0 lb

## 2019-03-10 DIAGNOSIS — Z01419 Encounter for gynecological examination (general) (routine) without abnormal findings: Secondary | ICD-10-CM | POA: Diagnosis not present
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
## 2019-03-17 ENCOUNTER — Ambulatory Visit: Payer: 59 | Admitting: Obstetrics & Gynecology

## 2019-03-17 ENCOUNTER — Encounter

## 2019-03-20 MED FILL — LINZESS 72 MCG CAPSULE: 72 | 30 days supply | Qty: 30 | Fill #0

## 2019-03-23 ENCOUNTER — Ambulatory Visit: Payer: 59 | Admitting: Obstetrics & Gynecology

## 2019-04-03 MED FILL — LINZESS 72 MCG CAPSULE: 72 | 30 days supply | Qty: 30 | Fill #0

## 2019-04-08 MED FILL — PANTOPRAZOLE SOD DR 40 MG T: 40 | 30 days supply | Qty: 30 | Fill #2

## 2019-04-09 MED FILL — HYDROXYZINE PAM 50 MG CAP: 50 | 30 days supply | Qty: 30 | Fill #0

## 2019-04-15 MED FILL — ESZOPICLONE 3 MG TABS: 3 | 30 days supply | Qty: 30 | Fill #0

## 2019-04-16 DIAGNOSIS — R05 Cough: Secondary | ICD-10-CM | POA: Diagnosis not present

## 2019-04-16 DIAGNOSIS — D126 Benign neoplasm of colon, unspecified: Secondary | ICD-10-CM | POA: Diagnosis not present

## 2019-04-16 DIAGNOSIS — E669 Obesity, unspecified: Secondary | ICD-10-CM | POA: Diagnosis not present

## 2019-04-16 DIAGNOSIS — M25561 Pain in right knee: Secondary | ICD-10-CM | POA: Diagnosis not present

## 2019-04-16 DIAGNOSIS — J309 Allergic rhinitis, unspecified: Secondary | ICD-10-CM | POA: Diagnosis not present

## 2019-04-16 DIAGNOSIS — F4321 Adjustment disorder with depressed mood: Secondary | ICD-10-CM | POA: Diagnosis not present

## 2019-04-16 DIAGNOSIS — F909 Attention-deficit hyperactivity disorder, unspecified type: Secondary | ICD-10-CM | POA: Diagnosis not present

## 2019-04-16 DIAGNOSIS — E785 Hyperlipidemia, unspecified: Secondary | ICD-10-CM | POA: Diagnosis not present

## 2019-04-16 MED FILL — FLUTICASONE PROP 50 MCG SPR: 50 | 30 days supply | Qty: 16 | Fill #0

## 2019-04-16 MED FILL — ALBUTEROL SULFATE HFA 108 (: 108 (90 BAS | 25 days supply | Qty: 18 | Fill #0

## 2019-05-07 DIAGNOSIS — J45909 Unspecified asthma, uncomplicated: Secondary | ICD-10-CM | POA: Diagnosis not present

## 2019-05-07 DIAGNOSIS — J309 Allergic rhinitis, unspecified: Secondary | ICD-10-CM | POA: Diagnosis not present

## 2019-05-07 DIAGNOSIS — R05 Cough: Secondary | ICD-10-CM | POA: Diagnosis not present

## 2019-05-07 DIAGNOSIS — K219 Gastro-esophageal reflux disease without esophagitis: Secondary | ICD-10-CM | POA: Diagnosis not present

## 2019-05-07 MED FILL — predniSONE 5 MG (21) TBPK: 5 | 6 days supply | Qty: 21 | Fill #0

## 2019-05-07 MED FILL — MONTELUKAST SOD 10 MG TAB: 10 | 90 days supply | Qty: 90 | Fill #0

## 2019-05-21 MED FILL — ROSUVASTATIN CALCIUM 10 MG: 10 | 90 days supply | Qty: 90 | Fill #1

## 2019-05-21 MED FILL — HYDROXYZINE PAM 50 MG CAP: 50 | 30 days supply | Qty: 30 | Fill #0

## 2019-05-21 MED FILL — PANTOPRAZOLE SOD DR 40 MG T: 40 | 30 days supply | Qty: 30 | Fill #3

## 2019-05-21 MED FILL — ESZOPICLONE 3 MG TABS: 3 | 30 days supply | Qty: 30 | Fill #1

## 2019-06-02 MED FILL — AMPHETAMINE-DEXTROAMPHETAMI: 30 | 30 days supply | Qty: 60 | Fill #0

## 2019-06-18 DIAGNOSIS — H2513 Age-related nuclear cataract, bilateral: Secondary | ICD-10-CM | POA: Diagnosis not present

## 2019-06-18 DIAGNOSIS — H35362 Drusen (degenerative) of macula, left eye: Secondary | ICD-10-CM | POA: Diagnosis not present

## 2019-06-18 DIAGNOSIS — H524 Presbyopia: Secondary | ICD-10-CM | POA: Diagnosis not present

## 2019-06-18 DIAGNOSIS — H35033 Hypertensive retinopathy, bilateral: Secondary | ICD-10-CM | POA: Diagnosis not present

## 2019-06-24 MED FILL — ESZOPICLONE 3 MG TABS: 3 | 30 days supply | Qty: 30 | Fill #2

## 2019-06-24 MED FILL — HYDROXYZINE PAM 50 MG CAP: 50 | 30 days supply | Qty: 30 | Fill #1

## 2019-07-13 MED FILL — PANTOPRAZOLE SOD DR 40 MG T: 40 | 30 days supply | Qty: 30 | Fill #4

## 2019-07-20 DIAGNOSIS — L668 Other cicatricial alopecia: Secondary | ICD-10-CM | POA: Diagnosis not present

## 2019-07-20 MED FILL — FLUOCINOLONE ACETONIDE SCAL: 0.01 | 30 days supply | Qty: 118 | Fill #0

## 2019-07-24 MED FILL — ESZOPICLONE 3 MG TABS: 3 | 30 days supply | Qty: 30 | Fill #3

## 2019-07-24 MED FILL — HYDROXYZINE PAM 50 MG CAP: 50 | 30 days supply | Qty: 30 | Fill #2

## 2019-07-28 MED FILL — LINZESS 72 MCG CAPSULE: 72 | 30 days supply | Qty: 30 | Fill #1

## 2019-07-28 MED FILL — AMPHETAMINE-DEXTROAMPHETAMI: 30 | 30 days supply | Qty: 60 | Fill #0

## 2019-08-28 MED FILL — HYDROXYZINE PAM 50 MG CAP: 50 | 30 days supply | Qty: 30 | Fill #3

## 2019-08-28 MED FILL — PANTOPRAZOLE SOD DR 40 MG T: 40 | 90 days supply | Qty: 90 | Fill #5

## 2019-09-02 MED FILL — AMPHETAMINE-DEXTROAMPHETAMI: 30 | 30 days supply | Qty: 60 | Fill #0

## 2019-09-02 MED FILL — ESZOPICLONE 3 MG TABS: 3 | 30 days supply | Qty: 30 | Fill #0

## 2019-09-07 DIAGNOSIS — L668 Other cicatricial alopecia: Secondary | ICD-10-CM | POA: Diagnosis not present

## 2019-09-07 DIAGNOSIS — L219 Seborrheic dermatitis, unspecified: Secondary | ICD-10-CM | POA: Diagnosis not present

## 2019-09-08 MED FILL — ROSUVASTATIN CALCIUM 10 MG: 10 | 90 days supply | Qty: 90 | Fill #0

## 2019-10-09 MED FILL — HYDROXYZINE PAM 50 MG CAP: 50 | 30 days supply | Qty: 30 | Fill #0

## 2019-10-09 MED FILL — ESZOPICLONE 3 MG TABS: 3 | 30 days supply | Qty: 30 | Fill #1

## 2019-11-04 ENCOUNTER — Telehealth: Payer: Self-pay | Admitting: Obstetrics & Gynecology

## 2019-11-04 NOTE — Telephone Encounter (Signed)
Patient has a rash on her breast,. She said this often happens this time of year and wondering if she should see her PCP of Dr.Miller?

## 2019-11-04 NOTE — Telephone Encounter (Signed)
Spoke to Terry Salazar. Terry Salazar states has rash under breast and uses antifungal cream for this and is worse today with redness and irritation. Has dealt with this for years. Would like to see Dr Sabra Heck. Terry Salazar scheduled for OV on 11/09/2019 10 am with Dr Sabra Heck. Terry Salazar declines earlier appt with another provider or PCP.   Routing to Dr Sabra Heck for review and will close encounter

## 2019-11-09 ENCOUNTER — Ambulatory Visit: Payer: Self-pay | Admitting: Obstetrics & Gynecology

## 2019-11-09 ENCOUNTER — Telehealth: Payer: Self-pay | Admitting: Obstetrics & Gynecology

## 2019-11-09 NOTE — Telephone Encounter (Signed)
Patient canceled her breast rash appoitment today via the answering machine. I left her a message to call and reschedule.

## 2019-11-09 NOTE — Telephone Encounter (Signed)
Patient returned call to reschedule breast problem appointment.

## 2019-11-09 NOTE — Telephone Encounter (Signed)
Call to patient to reschedule appointment.  Patient states she had appointment this am but was unable to come and wishes to reschedule.  Advised of cancellation policy. Patient declined to reschedule. Stressed importance of office visit for evaluation for breast issue. Patient states she will have area checked.   Routing to Dr Sabra Heck for review.

## 2019-11-13 MED FILL — ESZOPICLONE 3 MG TABS: 3 | 30 days supply | Qty: 30 | Fill #2

## 2019-11-13 MED FILL — HYDROXYZINE PAM 50 MG CAP: 50 | 30 days supply | Qty: 30 | Fill #1

## 2019-11-23 DIAGNOSIS — L821 Other seborrheic keratosis: Secondary | ICD-10-CM | POA: Diagnosis not present

## 2019-11-23 DIAGNOSIS — L304 Erythema intertrigo: Secondary | ICD-10-CM | POA: Diagnosis not present

## 2019-11-23 DIAGNOSIS — L668 Other cicatricial alopecia: Secondary | ICD-10-CM | POA: Diagnosis not present

## 2019-12-09 DIAGNOSIS — N62 Hypertrophy of breast: Secondary | ICD-10-CM | POA: Diagnosis not present

## 2019-12-22 MED FILL — HYDROXYZINE PAM 50 MG CAP: 50 | 30 days supply | Qty: 30 | Fill #2

## 2019-12-22 MED FILL — ESZOPICLONE 3 MG TABS: 3 | 30 days supply | Qty: 30 | Fill #3

## 2019-12-28 DIAGNOSIS — Z0000 Encounter for general adult medical examination without abnormal findings: Secondary | ICD-10-CM | POA: Diagnosis not present

## 2019-12-28 DIAGNOSIS — E7849 Other hyperlipidemia: Secondary | ICD-10-CM | POA: Diagnosis not present

## 2019-12-28 DIAGNOSIS — R82998 Other abnormal findings in urine: Secondary | ICD-10-CM | POA: Diagnosis not present

## 2019-12-29 ENCOUNTER — Encounter: Payer: Self-pay | Admitting: Obstetrics & Gynecology

## 2019-12-29 DIAGNOSIS — Z9884 Bariatric surgery status: Secondary | ICD-10-CM | POA: Diagnosis not present

## 2019-12-29 DIAGNOSIS — R7989 Other specified abnormal findings of blood chemistry: Secondary | ICD-10-CM | POA: Diagnosis not present

## 2020-01-07 DIAGNOSIS — J45909 Unspecified asthma, uncomplicated: Secondary | ICD-10-CM | POA: Diagnosis not present

## 2020-01-07 DIAGNOSIS — E785 Hyperlipidemia, unspecified: Secondary | ICD-10-CM | POA: Diagnosis not present

## 2020-01-07 DIAGNOSIS — D649 Anemia, unspecified: Secondary | ICD-10-CM | POA: Diagnosis not present

## 2020-01-07 DIAGNOSIS — Z1212 Encounter for screening for malignant neoplasm of rectum: Secondary | ICD-10-CM | POA: Diagnosis not present

## 2020-01-07 DIAGNOSIS — Z1331 Encounter for screening for depression: Secondary | ICD-10-CM | POA: Diagnosis not present

## 2020-01-07 DIAGNOSIS — D126 Benign neoplasm of colon, unspecified: Secondary | ICD-10-CM | POA: Diagnosis not present

## 2020-01-07 DIAGNOSIS — F4321 Adjustment disorder with depressed mood: Secondary | ICD-10-CM | POA: Diagnosis not present

## 2020-01-07 DIAGNOSIS — K219 Gastro-esophageal reflux disease without esophagitis: Secondary | ICD-10-CM | POA: Diagnosis not present

## 2020-01-07 DIAGNOSIS — E669 Obesity, unspecified: Secondary | ICD-10-CM | POA: Diagnosis not present

## 2020-01-07 DIAGNOSIS — J309 Allergic rhinitis, unspecified: Secondary | ICD-10-CM | POA: Diagnosis not present

## 2020-01-07 DIAGNOSIS — Z0000 Encounter for general adult medical examination without abnormal findings: Secondary | ICD-10-CM | POA: Diagnosis not present

## 2020-01-15 ENCOUNTER — Other Ambulatory Visit: Payer: Self-pay | Admitting: Internal Medicine

## 2020-01-15 DIAGNOSIS — Z1231 Encounter for screening mammogram for malignant neoplasm of breast: Secondary | ICD-10-CM

## 2020-01-18 DIAGNOSIS — L668 Other cicatricial alopecia: Secondary | ICD-10-CM | POA: Diagnosis not present

## 2020-01-18 MED FILL — FLUOCINOLONE ACETONIDE SCAL: 0.01 | 30 days supply | Qty: 118 | Fill #0

## 2020-01-20 MED FILL — PANTOPRAZOLE SOD DR 40 MG T: 40 | 30 days supply | Qty: 30 | Fill #0

## 2020-01-22 MED FILL — HYDROXYZINE PAM 50 MG CAP: 50 | 30 days supply | Qty: 30 | Fill #3

## 2020-01-22 MED FILL — AMPHETAMINE-DEXTROAMPHETAMI: 30 | 30 days supply | Qty: 60 | Fill #0

## 2020-01-30 MED FILL — ESZOPICLONE 3 MG TABS: 3 | 30 days supply | Qty: 30 | Fill #0

## 2020-03-03 MED FILL — PANTOPRAZOLE SOD DR 40 MG T: 40 | 30 days supply | Qty: 30 | Fill #1

## 2020-03-03 MED FILL — ESZOPICLONE 3 MG TABS: 3 | 30 days supply | Qty: 30 | Fill #1

## 2020-03-04 ENCOUNTER — Ambulatory Visit: Payer: 59

## 2020-03-04 MED FILL — MELOXICAM 15 MG TABLET: 15 | 30 days supply | Qty: 30 | Fill #0

## 2020-03-07 ENCOUNTER — Other Ambulatory Visit (HOSPITAL_COMMUNITY): Payer: Self-pay | Admitting: Physician Assistant

## 2020-03-07 MED FILL — HYDROXYZINE PAM 50 MG CAP: 50 | 30 days supply | Qty: 30 | Fill #0

## 2020-03-21 ENCOUNTER — Other Ambulatory Visit: Payer: Self-pay

## 2020-03-21 ENCOUNTER — Ambulatory Visit
Admission: RE | Admit: 2020-03-21 | Discharge: 2020-03-21 | Disposition: A | Discharge status: Home / Self Care | Payer: 59 | Source: Ambulatory Visit | Attending: Internal Medicine | Admitting: Internal Medicine

## 2020-03-21 DIAGNOSIS — Z1231 Encounter for screening mammogram for malignant neoplasm of breast: Secondary | ICD-10-CM

## 2020-04-07 MED FILL — ESZOPICLONE 3 MG TABS: 3 | 30 days supply | Qty: 30 | Fill #2

## 2020-04-07 MED FILL — HYDROXYZINE PAM 50 MG CAP: 50 | 30 days supply | Qty: 30 | Fill #1

## 2020-04-29 DIAGNOSIS — M65331 Trigger finger, right middle finger: Secondary | ICD-10-CM | POA: Diagnosis not present

## 2020-05-12 ENCOUNTER — Other Ambulatory Visit (HOSPITAL_COMMUNITY): Payer: Self-pay | Admitting: Internal Medicine

## 2020-05-12 MED FILL — PANTOPRAZOLE SOD DR 40 MG T: 40 | 90 days supply | Qty: 90 | Fill #0

## 2020-05-12 MED FILL — HYDROXYZINE PAM 50 MG CAP: 50 | 30 days supply | Qty: 30 | Fill #2

## 2020-05-12 MED FILL — ESZOPICLONE 3 MG TABS: 3 | 30 days supply | Qty: 30 | Fill #3

## 2020-06-03 ENCOUNTER — Other Ambulatory Visit (HOSPITAL_COMMUNITY): Payer: Self-pay | Admitting: Physician Assistant

## 2020-06-04 MED FILL — AMPHETAMINE-DEXTROAMPHETAMI: 30 | 30 days supply | Qty: 60 | Fill #0

## 2020-06-17 MED FILL — ESZOPICLONE 3 MG TABS: 3 | 30 days supply | Qty: 30 | Fill #4

## 2020-06-17 MED FILL — HYDROXYZINE PAM 50 MG CAP: 50 | 30 days supply | Qty: 30 | Fill #3

## 2020-07-19 DIAGNOSIS — M65331 Trigger finger, right middle finger: Secondary | ICD-10-CM | POA: Diagnosis not present

## 2020-07-22 ENCOUNTER — Other Ambulatory Visit (HOSPITAL_COMMUNITY): Payer: Self-pay | Admitting: Physician Assistant

## 2020-07-22 MED FILL — diazePAM 5 MG TABS: 5 | 1 days supply | Qty: 2 | Fill #0

## 2020-07-25 ENCOUNTER — Other Ambulatory Visit (HOSPITAL_COMMUNITY): Payer: Self-pay | Admitting: Physician Assistant

## 2020-07-25 MED FILL — PANTOPRAZOLE SOD DR 40 MG T: 40 | 90 days supply | Qty: 90 | Fill #1

## 2020-07-25 MED FILL — HYDROXYZINE PAM 50 MG CAP: 50 | 30 days supply | Qty: 30 | Fill #4

## 2020-07-25 MED FILL — ESZOPICLONE 3 MG TABS: 3 | 30 days supply | Qty: 30 | Fill #5

## 2020-07-26 MED FILL — AMPHETAMINE-DEXTROAMPHETAMI: 30 | 30 days supply | Qty: 60 | Fill #0

## 2020-08-02 ENCOUNTER — Other Ambulatory Visit (HOSPITAL_COMMUNITY): Payer: Self-pay | Admitting: Registered Nurse

## 2020-08-02 DIAGNOSIS — K5900 Constipation, unspecified: Secondary | ICD-10-CM | POA: Diagnosis not present

## 2020-08-02 DIAGNOSIS — B001 Herpesviral vesicular dermatitis: Secondary | ICD-10-CM | POA: Diagnosis not present

## 2020-08-02 DIAGNOSIS — M65331 Trigger finger, right middle finger: Secondary | ICD-10-CM | POA: Diagnosis not present

## 2020-08-02 DIAGNOSIS — R599 Enlarged lymph nodes, unspecified: Secondary | ICD-10-CM | POA: Diagnosis not present

## 2020-08-02 MED FILL — valACYclovir HCL 1 GM TABS: 1 | 7 days supply | Qty: 30 | Fill #0

## 2020-08-02 MED FILL — HYDROCODON-APAP 5-325: 5-325 | 5 days supply | Qty: 15 | Fill #0

## 2020-08-02 MED FILL — LINZESS 145 MCG CAPSULE: 145 | 90 days supply | Qty: 90 | Fill #0

## 2020-08-30 ENCOUNTER — Other Ambulatory Visit (HOSPITAL_COMMUNITY): Payer: Self-pay | Admitting: Physician Assistant

## 2020-08-30 MED FILL — HYDROXYZINE PAMOATE 50 MG C: 50 | 30 days supply | Qty: 30 | Fill #5

## 2020-08-31 MED FILL — ESZOPICLONE 3 MG TABS: 3 | 30 days supply | Qty: 30 | Fill #0

## 2020-08-31 MED FILL — AMPHETAMINE-DEXTROAMPHETAMI: 30 | 30 days supply | Qty: 60 | Fill #0

## 2020-09-19 DIAGNOSIS — Z9889 Other specified postprocedural states: Secondary | ICD-10-CM | POA: Diagnosis not present

## 2020-09-19 DIAGNOSIS — H524 Presbyopia: Secondary | ICD-10-CM | POA: Diagnosis not present

## 2020-09-19 DIAGNOSIS — H35362 Drusen (degenerative) of macula, left eye: Secondary | ICD-10-CM | POA: Diagnosis not present

## 2020-09-19 DIAGNOSIS — H2513 Age-related nuclear cataract, bilateral: Secondary | ICD-10-CM | POA: Diagnosis not present

## 2020-09-30 ENCOUNTER — Other Ambulatory Visit (HOSPITAL_COMMUNITY): Payer: Self-pay | Admitting: Family Medicine

## 2020-09-30 DIAGNOSIS — K219 Gastro-esophageal reflux disease without esophagitis: Secondary | ICD-10-CM | POA: Diagnosis not present

## 2020-09-30 DIAGNOSIS — R635 Abnormal weight gain: Secondary | ICD-10-CM | POA: Diagnosis not present

## 2020-09-30 DIAGNOSIS — E669 Obesity, unspecified: Secondary | ICD-10-CM | POA: Diagnosis not present

## 2020-09-30 DIAGNOSIS — E785 Hyperlipidemia, unspecified: Secondary | ICD-10-CM | POA: Diagnosis not present

## 2020-09-30 DIAGNOSIS — L089 Local infection of the skin and subcutaneous tissue, unspecified: Secondary | ICD-10-CM | POA: Diagnosis not present

## 2020-09-30 DIAGNOSIS — M25552 Pain in left hip: Secondary | ICD-10-CM | POA: Diagnosis not present

## 2020-09-30 MED FILL — HYDROXYZINE PAMOATE 50 MG C: 50 | 30 days supply | Qty: 30 | Fill #0

## 2020-09-30 MED FILL — ESZOPICLONE 3 MG TABS: 3 | 30 days supply | Qty: 30 | Fill #1

## 2020-09-30 MED FILL — DOXYCYCLINE HYCLATE 100 MG: 100 | 7 days supply | Qty: 14 | Fill #0

## 2020-09-30 MED FILL — PHENTERMINE 37.5 MG TABLET: 37.5 | 30 days supply | Qty: 30 | Fill #0

## 2020-09-30 MED FILL — MUPIROCIN 2% OINTMENT: 2 | 7 days supply | Qty: 22 | Fill #0

## 2020-10-28 DIAGNOSIS — E669 Obesity, unspecified: Secondary | ICD-10-CM | POA: Diagnosis not present

## 2020-10-28 DIAGNOSIS — Z79899 Other long term (current) drug therapy: Secondary | ICD-10-CM | POA: Diagnosis not present

## 2020-10-28 DIAGNOSIS — R635 Abnormal weight gain: Secondary | ICD-10-CM | POA: Diagnosis not present

## 2020-10-28 DIAGNOSIS — L089 Local infection of the skin and subcutaneous tissue, unspecified: Secondary | ICD-10-CM | POA: Diagnosis not present

## 2020-11-03 MED FILL — HYDROXYZINE PAMOATE 50 MG C: 50 | 30 days supply | Qty: 30 | Fill #1

## 2020-11-03 MED FILL — PHENTERMINE 37.5 MG TABLET: 37.5 | 30 days supply | Qty: 30 | Fill #1

## 2020-11-03 MED FILL — ESZOPICLONE 3 MG TABS: 3 | 30 days supply | Qty: 30 | Fill #2

## 2020-11-10 ENCOUNTER — Other Ambulatory Visit (HOSPITAL_COMMUNITY): Payer: Self-pay

## 2020-11-10 MED ORDER — LINACLOTIDE 72 MCG PO CAPS
ORAL_CAPSULE | Freq: Once | ORAL | 3 refills | Status: DC
  Filled 2020-11-10 – 2021-06-08 (×2): qty 30, 30d supply, fill #0

## 2020-11-17 ENCOUNTER — Other Ambulatory Visit (HOSPITAL_COMMUNITY): Payer: Self-pay

## 2020-12-09 ENCOUNTER — Other Ambulatory Visit (HOSPITAL_COMMUNITY): Payer: Self-pay | Admitting: Physician Assistant

## 2020-12-09 ENCOUNTER — Other Ambulatory Visit (HOSPITAL_COMMUNITY): Payer: Self-pay

## 2020-12-09 MED FILL — Pantoprazole Sodium EC Tab 40 MG (Base Equiv): ORAL | 90 days supply | Qty: 90 | Fill #0 | Status: AC

## 2020-12-09 MED FILL — Hydroxyzine Pamoate Cap 50 MG: ORAL | 30 days supply | Qty: 30 | Fill #0 | Status: AC

## 2020-12-09 MED FILL — Eszopiclone Tab 3 MG: ORAL | 30 days supply | Qty: 30 | Fill #0 | Status: AC

## 2021-01-07 ENCOUNTER — Other Ambulatory Visit (HOSPITAL_COMMUNITY): Payer: Self-pay

## 2021-01-07 DIAGNOSIS — F900 Attention-deficit hyperactivity disorder, predominantly inattentive type: Secondary | ICD-10-CM | POA: Diagnosis not present

## 2021-01-07 MED ORDER — AMPHETAMINE-DEXTROAMPHETAMINE 30 MG PO TABS
ORAL_TABLET | ORAL | 0 refills | Status: DC
  Filled 2021-05-04: qty 60, 30d supply, fill #0

## 2021-01-07 MED ORDER — AMPHETAMINE-DEXTROAMPHETAMINE 30 MG PO TABS
ORAL_TABLET | ORAL | 0 refills | Status: DC
  Filled 2021-02-22: qty 60, 30d supply, fill #0

## 2021-01-07 MED ORDER — AMPHETAMINE-DEXTROAMPHETAMINE 30 MG PO TABS
ORAL_TABLET | ORAL | 0 refills | Status: DC
  Filled 2021-01-07: qty 60, 30d supply, fill #0

## 2021-01-13 ENCOUNTER — Other Ambulatory Visit (HOSPITAL_COMMUNITY): Payer: Self-pay

## 2021-01-13 ENCOUNTER — Other Ambulatory Visit (HOSPITAL_COMMUNITY): Payer: Self-pay | Admitting: Physician Assistant

## 2021-01-13 MED ORDER — ESZOPICLONE 3 MG PO TABS
ORAL_TABLET | ORAL | 5 refills | Status: DC
  Filled 2021-01-13: qty 30, 30d supply, fill #0
  Filled 2021-02-22: qty 30, 30d supply, fill #1
  Filled 2021-03-30: qty 30, 30d supply, fill #2
  Filled 2021-05-04: qty 30, 30d supply, fill #3
  Filled 2021-06-09: qty 30, 30d supply, fill #4

## 2021-01-13 MED ORDER — ESZOPICLONE 3 MG PO TABS: ORAL_TABLET | Freq: Every day | ORAL | 0 refills | Status: DC

## 2021-01-13 MED FILL — Hydroxyzine Pamoate Cap 50 MG: ORAL | 30 days supply | Qty: 30 | Fill #1 | Status: AC

## 2021-01-16 ENCOUNTER — Other Ambulatory Visit (HOSPITAL_COMMUNITY): Payer: Self-pay

## 2021-01-16 MED ORDER — PANTOPRAZOLE SODIUM 40 MG PO TBEC
1.0000 | DELAYED_RELEASE_TABLET | Freq: Every day | ORAL | 2 refills | Status: DC
  Filled 2021-01-16 – 2021-05-04 (×2): qty 90, 90d supply, fill #0

## 2021-02-22 ENCOUNTER — Other Ambulatory Visit (HOSPITAL_COMMUNITY): Payer: Self-pay | Admitting: Physician Assistant

## 2021-02-22 ENCOUNTER — Other Ambulatory Visit (HOSPITAL_COMMUNITY): Payer: Self-pay

## 2021-02-22 MED ORDER — HYDROXYZINE PAMOATE 50 MG PO CAPS
ORAL_CAPSULE | ORAL | 5 refills | Status: DC
  Filled 2021-02-22: qty 30, 30d supply, fill #0
  Filled 2021-03-30: qty 30, 30d supply, fill #1
  Filled 2021-05-04: qty 30, 30d supply, fill #2
  Filled 2021-06-08: qty 30, 30d supply, fill #3
  Filled 2021-07-14: qty 30, 30d supply, fill #4
  Filled 2021-08-23: qty 30, 30d supply, fill #5

## 2021-03-14 DIAGNOSIS — D649 Anemia, unspecified: Secondary | ICD-10-CM | POA: Diagnosis not present

## 2021-03-14 DIAGNOSIS — E785 Hyperlipidemia, unspecified: Secondary | ICD-10-CM | POA: Diagnosis not present

## 2021-03-15 DIAGNOSIS — D649 Anemia, unspecified: Secondary | ICD-10-CM | POA: Diagnosis not present

## 2021-03-21 ENCOUNTER — Other Ambulatory Visit (HOSPITAL_COMMUNITY): Payer: Self-pay

## 2021-03-21 DIAGNOSIS — K5900 Constipation, unspecified: Secondary | ICD-10-CM | POA: Diagnosis not present

## 2021-03-21 DIAGNOSIS — E785 Hyperlipidemia, unspecified: Secondary | ICD-10-CM | POA: Diagnosis not present

## 2021-03-21 DIAGNOSIS — D649 Anemia, unspecified: Secondary | ICD-10-CM | POA: Diagnosis not present

## 2021-03-21 DIAGNOSIS — D126 Benign neoplasm of colon, unspecified: Secondary | ICD-10-CM | POA: Diagnosis not present

## 2021-03-21 DIAGNOSIS — Z0000 Encounter for general adult medical examination without abnormal findings: Secondary | ICD-10-CM | POA: Diagnosis not present

## 2021-03-21 DIAGNOSIS — E669 Obesity, unspecified: Secondary | ICD-10-CM | POA: Diagnosis not present

## 2021-03-21 DIAGNOSIS — J45909 Unspecified asthma, uncomplicated: Secondary | ICD-10-CM | POA: Diagnosis not present

## 2021-03-21 DIAGNOSIS — K219 Gastro-esophageal reflux disease without esophagitis: Secondary | ICD-10-CM | POA: Diagnosis not present

## 2021-03-21 DIAGNOSIS — F909 Attention-deficit hyperactivity disorder, unspecified type: Secondary | ICD-10-CM | POA: Diagnosis not present

## 2021-03-21 DIAGNOSIS — R82998 Other abnormal findings in urine: Secondary | ICD-10-CM | POA: Diagnosis not present

## 2021-03-21 MED ORDER — ROSUVASTATIN CALCIUM 10 MG PO TABS
ORAL_TABLET | ORAL | 3 refills | Status: DC
  Filled 2021-03-21 – 2021-09-14 (×2): qty 90, 90d supply, fill #0

## 2021-03-29 ENCOUNTER — Other Ambulatory Visit (HOSPITAL_COMMUNITY): Payer: Self-pay

## 2021-03-30 ENCOUNTER — Other Ambulatory Visit (HOSPITAL_COMMUNITY): Payer: Self-pay

## 2021-04-25 ENCOUNTER — Other Ambulatory Visit: Payer: Self-pay | Admitting: Internal Medicine

## 2021-04-25 DIAGNOSIS — Z1231 Encounter for screening mammogram for malignant neoplasm of breast: Secondary | ICD-10-CM

## 2021-04-27 ENCOUNTER — Other Ambulatory Visit (HOSPITAL_COMMUNITY): Payer: Self-pay

## 2021-04-27 DIAGNOSIS — E785 Hyperlipidemia, unspecified: Secondary | ICD-10-CM | POA: Diagnosis not present

## 2021-04-27 DIAGNOSIS — Z7189 Other specified counseling: Secondary | ICD-10-CM | POA: Diagnosis not present

## 2021-04-27 DIAGNOSIS — E669 Obesity, unspecified: Secondary | ICD-10-CM | POA: Diagnosis not present

## 2021-04-27 DIAGNOSIS — F909 Attention-deficit hyperactivity disorder, unspecified type: Secondary | ICD-10-CM | POA: Diagnosis not present

## 2021-04-27 MED ORDER — MOUNJARO 5 MG/0.5ML ~~LOC~~ SOAJ
SUBCUTANEOUS | 6 refills | Status: DC
  Filled 2021-04-27: qty 2, 28d supply, fill #0
  Filled 2021-06-23: qty 2, 28d supply, fill #1
  Filled 2021-07-18: qty 2, 28d supply, fill #2
  Filled 2021-08-14: qty 2, 28d supply, fill #3
  Filled 2021-09-14: qty 2, 28d supply, fill #4
  Filled 2021-10-13: qty 2, 28d supply, fill #5
  Filled 2021-11-17 – 2021-11-20 (×2): qty 2, 28d supply, fill #6

## 2021-04-28 ENCOUNTER — Other Ambulatory Visit (HOSPITAL_COMMUNITY): Payer: Self-pay

## 2021-04-28 MED ORDER — MELOXICAM 15 MG PO TABS
ORAL_TABLET | ORAL | 3 refills | Status: DC
  Filled 2021-04-28: qty 90, 90d supply, fill #0

## 2021-05-04 ENCOUNTER — Other Ambulatory Visit (HOSPITAL_COMMUNITY): Payer: Self-pay

## 2021-05-18 ENCOUNTER — Other Ambulatory Visit: Payer: Self-pay

## 2021-05-18 ENCOUNTER — Ambulatory Visit
Admission: RE | Admit: 2021-05-18 | Discharge: 2021-05-18 | Disposition: A | Discharge status: Home / Self Care | Payer: 59 | Source: Ambulatory Visit | Attending: Internal Medicine | Admitting: Internal Medicine

## 2021-05-18 DIAGNOSIS — Z1231 Encounter for screening mammogram for malignant neoplasm of breast: Secondary | ICD-10-CM

## 2021-06-08 ENCOUNTER — Other Ambulatory Visit (HOSPITAL_COMMUNITY): Payer: Self-pay

## 2021-06-09 ENCOUNTER — Other Ambulatory Visit (HOSPITAL_COMMUNITY): Payer: Self-pay

## 2021-06-13 ENCOUNTER — Other Ambulatory Visit (HOSPITAL_COMMUNITY): Payer: Self-pay

## 2021-06-15 ENCOUNTER — Other Ambulatory Visit (HOSPITAL_COMMUNITY): Payer: Self-pay

## 2021-06-16 ENCOUNTER — Other Ambulatory Visit (HOSPITAL_COMMUNITY): Payer: Self-pay

## 2021-06-17 ENCOUNTER — Other Ambulatory Visit (HOSPITAL_COMMUNITY): Payer: Self-pay

## 2021-06-19 ENCOUNTER — Other Ambulatory Visit (HOSPITAL_COMMUNITY): Payer: Self-pay

## 2021-06-22 ENCOUNTER — Other Ambulatory Visit (HOSPITAL_COMMUNITY): Payer: Self-pay

## 2021-06-23 ENCOUNTER — Other Ambulatory Visit (HOSPITAL_COMMUNITY): Payer: Self-pay

## 2021-07-14 ENCOUNTER — Other Ambulatory Visit (HOSPITAL_COMMUNITY): Payer: Self-pay

## 2021-07-17 ENCOUNTER — Other Ambulatory Visit (HOSPITAL_COMMUNITY): Payer: Self-pay

## 2021-07-18 ENCOUNTER — Other Ambulatory Visit (HOSPITAL_COMMUNITY): Payer: Self-pay

## 2021-07-19 ENCOUNTER — Other Ambulatory Visit (HOSPITAL_COMMUNITY): Payer: Self-pay

## 2021-07-19 MED ORDER — AMPHETAMINE-DEXTROAMPHETAMINE 30 MG PO TABS
30.0000 mg | ORAL_TABLET | Freq: Two times a day (BID) | ORAL | 0 refills | Status: DC
  Filled 2021-07-19: qty 60, 30d supply, fill #0

## 2021-07-20 ENCOUNTER — Other Ambulatory Visit (HOSPITAL_COMMUNITY): Payer: Self-pay

## 2021-07-20 MED ORDER — ESZOPICLONE 3 MG PO TABS
ORAL_TABLET | ORAL | 5 refills | Status: DC
  Filled 2021-07-20: qty 30, 30d supply, fill #0
  Filled 2021-08-23: qty 30, 30d supply, fill #1
  Filled 2021-11-03: qty 30, 30d supply, fill #2

## 2021-08-11 ENCOUNTER — Other Ambulatory Visit (HOSPITAL_COMMUNITY): Payer: Self-pay

## 2021-08-14 ENCOUNTER — Other Ambulatory Visit (HOSPITAL_COMMUNITY): Payer: Self-pay

## 2021-08-23 ENCOUNTER — Other Ambulatory Visit (HOSPITAL_COMMUNITY): Payer: Self-pay

## 2021-09-14 ENCOUNTER — Other Ambulatory Visit (HOSPITAL_COMMUNITY): Payer: Self-pay

## 2021-09-27 ENCOUNTER — Other Ambulatory Visit (HOSPITAL_COMMUNITY): Payer: Self-pay

## 2021-09-27 MED ORDER — HYDROXYZINE PAMOATE 50 MG PO CAPS
50.0000 mg | ORAL_CAPSULE | Freq: Every evening | ORAL | 0 refills | Status: DC | PRN
  Filled 2021-09-27: qty 30, 30d supply, fill #0

## 2021-09-27 MED ORDER — AMPHETAMINE-DEXTROAMPHETAMINE 30 MG PO TABS
30.0000 mg | ORAL_TABLET | Freq: Two times a day (BID) | ORAL | 0 refills | Status: DC
  Filled 2021-09-27: qty 60, 30d supply, fill #0

## 2021-09-27 MED ORDER — ESZOPICLONE 3 MG PO TABS
3.0000 mg | ORAL_TABLET | Freq: Every evening | ORAL | 0 refills | Status: DC
  Filled 2021-09-27: qty 30, 30d supply, fill #0

## 2021-09-28 ENCOUNTER — Other Ambulatory Visit (HOSPITAL_COMMUNITY): Payer: Self-pay

## 2021-10-03 ENCOUNTER — Other Ambulatory Visit (HOSPITAL_COMMUNITY): Payer: Self-pay

## 2021-10-03 DIAGNOSIS — F900 Attention-deficit hyperactivity disorder, predominantly inattentive type: Secondary | ICD-10-CM | POA: Diagnosis not present

## 2021-10-03 DIAGNOSIS — F4323 Adjustment disorder with mixed anxiety and depressed mood: Secondary | ICD-10-CM | POA: Diagnosis not present

## 2021-10-03 MED ORDER — AMPHETAMINE-DEXTROAMPHETAMINE 30 MG PO TABS
30.0000 mg | ORAL_TABLET | Freq: Two times a day (BID) | ORAL | 0 refills | Status: DC
  Filled 2022-01-19: qty 40, 20d supply, fill #0
  Filled 2022-01-19: qty 20, 10d supply, fill #0

## 2021-10-03 MED ORDER — AMPHETAMINE-DEXTROAMPHETAMINE 30 MG PO TABS: 30.0000 mg | ORAL_TABLET | Freq: Two times a day (BID) | ORAL | 0 refills | Status: DC

## 2021-10-13 ENCOUNTER — Other Ambulatory Visit (HOSPITAL_COMMUNITY): Payer: Self-pay

## 2021-10-14 ENCOUNTER — Other Ambulatory Visit (HOSPITAL_COMMUNITY): Payer: Self-pay

## 2021-11-03 ENCOUNTER — Other Ambulatory Visit (HOSPITAL_COMMUNITY): Payer: Self-pay

## 2021-11-08 ENCOUNTER — Other Ambulatory Visit (HOSPITAL_COMMUNITY): Payer: Self-pay

## 2021-11-09 ENCOUNTER — Other Ambulatory Visit (HOSPITAL_COMMUNITY): Payer: Self-pay

## 2021-11-09 MED ORDER — ESZOPICLONE 3 MG PO TABS
ORAL_TABLET | ORAL | 3 refills | Status: DC
  Filled 2021-11-09 – 2021-12-13 (×2): qty 30, 30d supply, fill #0
  Filled 2022-01-19: qty 30, 30d supply, fill #1
  Filled 2022-02-24: qty 30, 30d supply, fill #2
  Filled 2022-04-06: qty 30, 30d supply, fill #3

## 2021-11-09 MED ORDER — HYDROXYZINE PAMOATE 50 MG PO CAPS
ORAL_CAPSULE | ORAL | 5 refills | Status: DC
  Filled 2021-11-09: qty 30, 30d supply, fill #0
  Filled 2021-12-16: qty 30, 30d supply, fill #1
  Filled 2022-01-19: qty 30, 30d supply, fill #2
  Filled 2022-02-24: qty 30, 30d supply, fill #3
  Filled 2022-04-06: qty 30, 30d supply, fill #4
  Filled 2022-05-11: qty 30, 30d supply, fill #5

## 2021-11-10 ENCOUNTER — Other Ambulatory Visit (HOSPITAL_COMMUNITY): Payer: Self-pay

## 2021-11-17 ENCOUNTER — Other Ambulatory Visit (HOSPITAL_COMMUNITY): Payer: Self-pay

## 2021-11-20 ENCOUNTER — Other Ambulatory Visit (HOSPITAL_COMMUNITY): Payer: Self-pay

## 2021-12-13 ENCOUNTER — Other Ambulatory Visit (HOSPITAL_COMMUNITY): Payer: Self-pay

## 2021-12-13 MED ORDER — MOUNJARO 5 MG/0.5ML ~~LOC~~ SOAJ
SUBCUTANEOUS | 6 refills | Status: DC
  Filled 2021-12-13 – 2021-12-15 (×2): qty 2, 28d supply, fill #0
  Filled 2022-02-10: qty 2, 28d supply, fill #1
  Filled 2022-04-06: qty 2, 28d supply, fill #2

## 2021-12-13 MED ORDER — LINZESS 145 MCG PO CAPS
ORAL_CAPSULE | ORAL | 2 refills | Status: DC
  Filled 2021-12-13: qty 90, 90d supply, fill #0
  Filled 2022-06-14: qty 30, 30d supply, fill #1
  Filled 2022-08-21 – 2022-11-10 (×3): qty 30, 30d supply, fill #2

## 2021-12-15 ENCOUNTER — Other Ambulatory Visit (HOSPITAL_COMMUNITY): Payer: Self-pay

## 2021-12-16 ENCOUNTER — Other Ambulatory Visit (HOSPITAL_COMMUNITY): Payer: Self-pay

## 2021-12-18 ENCOUNTER — Other Ambulatory Visit (HOSPITAL_COMMUNITY): Payer: Self-pay

## 2022-01-05 ENCOUNTER — Other Ambulatory Visit (HOSPITAL_COMMUNITY): Payer: Self-pay

## 2022-01-12 ENCOUNTER — Other Ambulatory Visit (HOSPITAL_COMMUNITY): Payer: Self-pay

## 2022-01-12 MED ORDER — WEGOVY 0.5 MG/0.5ML ~~LOC~~ SOAJ
SUBCUTANEOUS | 0 refills | Status: DC
  Filled 2022-01-12 – 2022-06-08 (×3): qty 2, 28d supply, fill #0

## 2022-01-12 MED ORDER — WEGOVY 1 MG/0.5ML ~~LOC~~ SOAJ: SUBCUTANEOUS | 5 refills | Status: DC

## 2022-01-17 ENCOUNTER — Other Ambulatory Visit (HOSPITAL_COMMUNITY): Payer: Self-pay

## 2022-01-17 DIAGNOSIS — L668 Other cicatricial alopecia: Secondary | ICD-10-CM | POA: Diagnosis not present

## 2022-01-17 DIAGNOSIS — L218 Other seborrheic dermatitis: Secondary | ICD-10-CM | POA: Diagnosis not present

## 2022-01-17 MED ORDER — FLUOCINOLONE ACETONIDE SCALP 0.01 % EX OIL
TOPICAL_OIL | CUTANEOUS | 2 refills | Status: AC
  Filled 2022-01-17: qty 118.28, 30d supply, fill #0

## 2022-01-18 ENCOUNTER — Other Ambulatory Visit (HOSPITAL_COMMUNITY): Payer: Self-pay

## 2022-01-19 ENCOUNTER — Other Ambulatory Visit (HOSPITAL_COMMUNITY): Payer: Self-pay

## 2022-02-12 ENCOUNTER — Other Ambulatory Visit (HOSPITAL_COMMUNITY): Payer: Self-pay

## 2022-02-15 ENCOUNTER — Other Ambulatory Visit (HOSPITAL_COMMUNITY): Payer: Self-pay

## 2022-02-24 ENCOUNTER — Other Ambulatory Visit (HOSPITAL_COMMUNITY): Payer: Self-pay

## 2022-02-27 DIAGNOSIS — D126 Benign neoplasm of colon, unspecified: Secondary | ICD-10-CM | POA: Diagnosis not present

## 2022-02-27 DIAGNOSIS — J45909 Unspecified asthma, uncomplicated: Secondary | ICD-10-CM | POA: Diagnosis not present

## 2022-02-27 DIAGNOSIS — E669 Obesity, unspecified: Secondary | ICD-10-CM | POA: Diagnosis not present

## 2022-02-27 DIAGNOSIS — M25552 Pain in left hip: Secondary | ICD-10-CM | POA: Diagnosis not present

## 2022-02-27 DIAGNOSIS — K219 Gastro-esophageal reflux disease without esophagitis: Secondary | ICD-10-CM | POA: Diagnosis not present

## 2022-02-27 DIAGNOSIS — E785 Hyperlipidemia, unspecified: Secondary | ICD-10-CM | POA: Diagnosis not present

## 2022-02-27 DIAGNOSIS — K5900 Constipation, unspecified: Secondary | ICD-10-CM | POA: Diagnosis not present

## 2022-02-27 DIAGNOSIS — D649 Anemia, unspecified: Secondary | ICD-10-CM | POA: Diagnosis not present

## 2022-02-27 DIAGNOSIS — F909 Attention-deficit hyperactivity disorder, unspecified type: Secondary | ICD-10-CM | POA: Diagnosis not present

## 2022-02-28 ENCOUNTER — Other Ambulatory Visit (HOSPITAL_COMMUNITY): Payer: Self-pay

## 2022-02-28 MED ORDER — MOUNJARO 7.5 MG/0.5ML ~~LOC~~ SOAJ
SUBCUTANEOUS | 1 refills | Status: DC
  Filled 2022-02-28 – 2022-04-06 (×2): qty 2, 28d supply, fill #0

## 2022-03-05 DIAGNOSIS — H35362 Drusen (degenerative) of macula, left eye: Secondary | ICD-10-CM | POA: Diagnosis not present

## 2022-03-05 DIAGNOSIS — H524 Presbyopia: Secondary | ICD-10-CM | POA: Diagnosis not present

## 2022-03-05 DIAGNOSIS — H25813 Combined forms of age-related cataract, bilateral: Secondary | ICD-10-CM | POA: Diagnosis not present

## 2022-04-06 ENCOUNTER — Other Ambulatory Visit (HOSPITAL_COMMUNITY): Payer: Self-pay

## 2022-04-13 DIAGNOSIS — E785 Hyperlipidemia, unspecified: Secondary | ICD-10-CM | POA: Diagnosis not present

## 2022-04-13 DIAGNOSIS — L738 Other specified follicular disorders: Secondary | ICD-10-CM | POA: Diagnosis not present

## 2022-04-13 DIAGNOSIS — D649 Anemia, unspecified: Secondary | ICD-10-CM | POA: Diagnosis not present

## 2022-04-13 DIAGNOSIS — L668 Other cicatricial alopecia: Secondary | ICD-10-CM | POA: Diagnosis not present

## 2022-04-13 DIAGNOSIS — R7989 Other specified abnormal findings of blood chemistry: Secondary | ICD-10-CM | POA: Diagnosis not present

## 2022-04-16 DIAGNOSIS — K219 Gastro-esophageal reflux disease without esophagitis: Secondary | ICD-10-CM | POA: Diagnosis not present

## 2022-04-16 DIAGNOSIS — Z1331 Encounter for screening for depression: Secondary | ICD-10-CM | POA: Diagnosis not present

## 2022-04-16 DIAGNOSIS — J45909 Unspecified asthma, uncomplicated: Secondary | ICD-10-CM | POA: Diagnosis not present

## 2022-04-16 DIAGNOSIS — R82998 Other abnormal findings in urine: Secondary | ICD-10-CM | POA: Diagnosis not present

## 2022-04-16 DIAGNOSIS — E785 Hyperlipidemia, unspecified: Secondary | ICD-10-CM | POA: Diagnosis not present

## 2022-04-16 DIAGNOSIS — Z0000 Encounter for general adult medical examination without abnormal findings: Secondary | ICD-10-CM | POA: Diagnosis not present

## 2022-04-16 DIAGNOSIS — D649 Anemia, unspecified: Secondary | ICD-10-CM | POA: Diagnosis not present

## 2022-04-16 DIAGNOSIS — F909 Attention-deficit hyperactivity disorder, unspecified type: Secondary | ICD-10-CM | POA: Diagnosis not present

## 2022-04-16 DIAGNOSIS — D126 Benign neoplasm of colon, unspecified: Secondary | ICD-10-CM | POA: Diagnosis not present

## 2022-04-16 DIAGNOSIS — Z1339 Encounter for screening examination for other mental health and behavioral disorders: Secondary | ICD-10-CM | POA: Diagnosis not present

## 2022-04-16 DIAGNOSIS — E669 Obesity, unspecified: Secondary | ICD-10-CM | POA: Diagnosis not present

## 2022-04-16 DIAGNOSIS — K5900 Constipation, unspecified: Secondary | ICD-10-CM | POA: Diagnosis not present

## 2022-04-17 ENCOUNTER — Other Ambulatory Visit (HOSPITAL_COMMUNITY): Payer: Self-pay

## 2022-04-17 MED ORDER — WEGOVY 1 MG/0.5ML ~~LOC~~ SOAJ
0.5000 mL | SUBCUTANEOUS | 5 refills | Status: DC
  Filled 2022-04-17: qty 2, 28d supply, fill #0

## 2022-04-18 ENCOUNTER — Other Ambulatory Visit (HOSPITAL_COMMUNITY): Payer: Self-pay

## 2022-04-24 ENCOUNTER — Other Ambulatory Visit (HOSPITAL_COMMUNITY): Payer: Self-pay

## 2022-05-11 ENCOUNTER — Other Ambulatory Visit (HOSPITAL_COMMUNITY): Payer: Self-pay

## 2022-05-11 MED ORDER — PANTOPRAZOLE SODIUM 40 MG PO TBEC
40.0000 mg | DELAYED_RELEASE_TABLET | Freq: Every day | ORAL | 3 refills | Status: DC
  Filled 2022-05-11: qty 90, 90d supply, fill #0
  Filled 2022-10-27 – 2022-10-31 (×2): qty 90, 90d supply, fill #1

## 2022-05-12 ENCOUNTER — Other Ambulatory Visit (HOSPITAL_COMMUNITY): Payer: Self-pay

## 2022-05-15 ENCOUNTER — Other Ambulatory Visit (HOSPITAL_COMMUNITY): Payer: Self-pay

## 2022-05-15 DIAGNOSIS — F4323 Adjustment disorder with mixed anxiety and depressed mood: Secondary | ICD-10-CM | POA: Diagnosis not present

## 2022-05-15 DIAGNOSIS — F900 Attention-deficit hyperactivity disorder, predominantly inattentive type: Secondary | ICD-10-CM | POA: Diagnosis not present

## 2022-05-15 DIAGNOSIS — G4700 Insomnia, unspecified: Secondary | ICD-10-CM | POA: Diagnosis not present

## 2022-05-15 MED ORDER — AMPHETAMINE-DEXTROAMPHETAMINE 30 MG PO TABS
30.0000 mg | ORAL_TABLET | Freq: Two times a day (BID) | ORAL | 0 refills | Status: DC
  Filled 2022-08-21: qty 60, 30d supply, fill #0

## 2022-05-15 MED ORDER — ESZOPICLONE 3 MG PO TABS
3.0000 mg | ORAL_TABLET | Freq: Every day | ORAL | 3 refills | Status: DC
  Filled 2022-05-15: qty 30, 30d supply, fill #0
  Filled 2022-06-14: qty 30, 30d supply, fill #1
  Filled 2022-07-18 (×2): qty 30, 30d supply, fill #2
  Filled 2022-08-18: qty 30, 30d supply, fill #3

## 2022-05-15 MED ORDER — HYDROXYZINE PAMOATE 50 MG PO CAPS
50.0000 mg | ORAL_CAPSULE | Freq: Every evening | ORAL | 5 refills | Status: DC | PRN
  Filled 2022-05-15 – 2022-06-14 (×2): qty 30, 30d supply, fill #0
  Filled 2022-07-18 (×2): qty 30, 30d supply, fill #1
  Filled 2022-08-18: qty 30, 30d supply, fill #2
  Filled 2022-09-22: qty 30, 30d supply, fill #3
  Filled 2022-12-03: qty 30, 30d supply, fill #4
  Filled 2023-01-14: qty 30, 30d supply, fill #5

## 2022-05-15 MED ORDER — AMPHETAMINE-DEXTROAMPHETAMINE 30 MG PO TABS
30.0000 mg | ORAL_TABLET | Freq: Two times a day (BID) | ORAL | 0 refills | Status: DC
  Filled 2022-05-15: qty 60, 30d supply, fill #0

## 2022-05-15 MED ORDER — AMPHETAMINE-DEXTROAMPHETAMINE 30 MG PO TABS
30.0000 mg | ORAL_TABLET | Freq: Two times a day (BID) | ORAL | 0 refills | Status: DC
  Filled 2022-10-12: qty 60, 30d supply, fill #0

## 2022-06-08 ENCOUNTER — Other Ambulatory Visit (HOSPITAL_COMMUNITY): Payer: Self-pay

## 2022-06-08 MED ORDER — WEGOVY 1.7 MG/0.75ML ~~LOC~~ SOAJ
1.7000 mg | SUBCUTANEOUS | 3 refills | Status: DC
  Filled 2022-06-08: qty 3, 28d supply, fill #0
  Filled 2022-10-03: qty 3, 28d supply, fill #1
  Filled 2022-10-27: qty 3, 28d supply, fill #2
  Filled 2022-11-24 – 2022-12-03 (×2): qty 3, 28d supply, fill #3

## 2022-06-14 ENCOUNTER — Other Ambulatory Visit (HOSPITAL_COMMUNITY): Payer: Self-pay

## 2022-06-15 ENCOUNTER — Other Ambulatory Visit (HOSPITAL_COMMUNITY): Payer: Self-pay

## 2022-06-25 DIAGNOSIS — L668 Other cicatricial alopecia: Secondary | ICD-10-CM | POA: Diagnosis not present

## 2022-06-26 ENCOUNTER — Other Ambulatory Visit (HOSPITAL_COMMUNITY): Payer: Self-pay

## 2022-06-29 ENCOUNTER — Other Ambulatory Visit (HOSPITAL_COMMUNITY): Payer: Self-pay

## 2022-07-04 ENCOUNTER — Other Ambulatory Visit (HOSPITAL_COMMUNITY): Payer: Self-pay

## 2022-07-06 ENCOUNTER — Other Ambulatory Visit (HOSPITAL_COMMUNITY): Payer: Self-pay

## 2022-07-09 ENCOUNTER — Other Ambulatory Visit (HOSPITAL_COMMUNITY): Payer: Self-pay

## 2022-07-10 ENCOUNTER — Other Ambulatory Visit (HOSPITAL_COMMUNITY): Payer: Self-pay

## 2022-07-16 ENCOUNTER — Other Ambulatory Visit: Payer: Self-pay | Admitting: Internal Medicine

## 2022-07-16 DIAGNOSIS — Z1231 Encounter for screening mammogram for malignant neoplasm of breast: Secondary | ICD-10-CM

## 2022-07-18 ENCOUNTER — Other Ambulatory Visit (HOSPITAL_COMMUNITY): Payer: Self-pay

## 2022-08-21 ENCOUNTER — Other Ambulatory Visit (HOSPITAL_COMMUNITY): Payer: Self-pay

## 2022-08-31 ENCOUNTER — Other Ambulatory Visit (HOSPITAL_COMMUNITY): Payer: Self-pay

## 2022-09-14 ENCOUNTER — Ambulatory Visit
Admission: RE | Admit: 2022-09-14 | Discharge: 2022-09-14 | Disposition: A | Discharge status: Home / Self Care | Payer: Commercial Managed Care - PPO | Source: Ambulatory Visit | Attending: Internal Medicine | Admitting: Internal Medicine

## 2022-09-14 DIAGNOSIS — Z1231 Encounter for screening mammogram for malignant neoplasm of breast: Secondary | ICD-10-CM | POA: Diagnosis not present

## 2022-09-22 ENCOUNTER — Other Ambulatory Visit (HOSPITAL_COMMUNITY): Payer: Self-pay

## 2022-09-28 ENCOUNTER — Other Ambulatory Visit (HOSPITAL_COMMUNITY): Payer: Self-pay

## 2022-09-28 MED ORDER — ESZOPICLONE 3 MG PO TABS
3.0000 mg | ORAL_TABLET | Freq: Every day | ORAL | 3 refills | Status: DC
  Filled 2022-09-28: qty 27, 27d supply, fill #0
  Filled 2022-09-28: qty 3, 3d supply, fill #0
  Filled 2023-02-27 (×2): qty 30, 30d supply, fill #1

## 2022-10-03 ENCOUNTER — Other Ambulatory Visit (HOSPITAL_COMMUNITY): Payer: Self-pay

## 2022-10-12 ENCOUNTER — Other Ambulatory Visit (HOSPITAL_COMMUNITY): Payer: Self-pay

## 2022-10-23 ENCOUNTER — Other Ambulatory Visit (HOSPITAL_COMMUNITY): Payer: Self-pay

## 2022-10-23 ENCOUNTER — Other Ambulatory Visit: Payer: Self-pay

## 2022-10-23 DIAGNOSIS — G4700 Insomnia, unspecified: Secondary | ICD-10-CM | POA: Diagnosis not present

## 2022-10-23 DIAGNOSIS — F4323 Adjustment disorder with mixed anxiety and depressed mood: Secondary | ICD-10-CM | POA: Diagnosis not present

## 2022-10-23 DIAGNOSIS — F900 Attention-deficit hyperactivity disorder, predominantly inattentive type: Secondary | ICD-10-CM | POA: Diagnosis not present

## 2022-10-23 MED ORDER — AMPHETAMINE-DEXTROAMPHETAMINE 30 MG PO TABS
30.0000 mg | ORAL_TABLET | Freq: Two times a day (BID) | ORAL | 0 refills | Status: DC
  Filled 2023-01-23: qty 60, 30d supply, fill #0

## 2022-10-23 MED ORDER — ESZOPICLONE 3 MG PO TABS
3.0000 mg | ORAL_TABLET | Freq: Every day | ORAL | 2 refills | Status: DC
  Filled 2022-11-10: qty 30, 30d supply, fill #0
  Filled 2022-12-20: qty 30, 30d supply, fill #1
  Filled 2023-01-23: qty 30, 30d supply, fill #2

## 2022-10-23 MED ORDER — AMPHETAMINE-DEXTROAMPHETAMINE 30 MG PO TABS
30.0000 mg | ORAL_TABLET | Freq: Two times a day (BID) | ORAL | 0 refills | Status: DC
  Filled 2022-11-24 – 2022-12-03 (×2): qty 60, 30d supply, fill #0

## 2022-10-23 MED ORDER — AMPHETAMINE-DEXTROAMPHETAMINE 30 MG PO TABS
30.0000 mg | ORAL_TABLET | Freq: Two times a day (BID) | ORAL | 0 refills | Status: DC
  Filled 2022-10-23: qty 60, 30d supply, fill #0

## 2022-10-23 MED ORDER — HYDROXYZINE PAMOATE 50 MG PO CAPS
50.0000 mg | ORAL_CAPSULE | Freq: Every evening | ORAL | 5 refills | Status: DC | PRN
  Filled 2022-10-23: qty 30, 30d supply, fill #0
  Filled 2023-02-27 (×2): qty 30, 30d supply, fill #1
  Filled 2023-04-01: qty 30, 30d supply, fill #2
  Filled 2023-09-20 (×2): qty 30, 30d supply, fill #3

## 2022-10-25 ENCOUNTER — Other Ambulatory Visit (HOSPITAL_COMMUNITY): Payer: Self-pay

## 2022-10-27 ENCOUNTER — Other Ambulatory Visit (HOSPITAL_COMMUNITY): Payer: Self-pay

## 2022-10-31 ENCOUNTER — Other Ambulatory Visit (HOSPITAL_COMMUNITY): Payer: Self-pay

## 2022-11-01 ENCOUNTER — Other Ambulatory Visit (HOSPITAL_COMMUNITY): Payer: Self-pay

## 2022-11-10 ENCOUNTER — Other Ambulatory Visit (HOSPITAL_COMMUNITY): Payer: Self-pay

## 2022-11-24 ENCOUNTER — Other Ambulatory Visit (HOSPITAL_COMMUNITY): Payer: Self-pay

## 2022-11-26 ENCOUNTER — Other Ambulatory Visit: Payer: Self-pay

## 2022-12-03 ENCOUNTER — Other Ambulatory Visit (HOSPITAL_COMMUNITY): Payer: Self-pay

## 2022-12-04 ENCOUNTER — Other Ambulatory Visit: Payer: Self-pay

## 2022-12-20 ENCOUNTER — Other Ambulatory Visit (HOSPITAL_COMMUNITY): Payer: Self-pay

## 2023-01-03 ENCOUNTER — Other Ambulatory Visit (HOSPITAL_COMMUNITY): Payer: Self-pay

## 2023-01-16 ENCOUNTER — Other Ambulatory Visit (HOSPITAL_COMMUNITY): Payer: Self-pay

## 2023-01-23 ENCOUNTER — Other Ambulatory Visit (HOSPITAL_COMMUNITY): Payer: Self-pay

## 2023-02-27 ENCOUNTER — Other Ambulatory Visit (HOSPITAL_COMMUNITY): Payer: Self-pay

## 2023-02-27 MED ORDER — MELOXICAM 15 MG PO TABS
15.0000 mg | ORAL_TABLET | Freq: Every day | ORAL | 0 refills | Status: DC
  Filled 2023-02-27: qty 90, 90d supply, fill #0

## 2023-02-27 MED ORDER — LINZESS 145 MCG PO CAPS
145.0000 ug | ORAL_CAPSULE | Freq: Every day | ORAL | 0 refills | Status: DC
  Filled 2023-02-27: qty 90, 90d supply, fill #0

## 2023-03-07 ENCOUNTER — Other Ambulatory Visit (HOSPITAL_COMMUNITY): Payer: Self-pay

## 2023-03-12 ENCOUNTER — Other Ambulatory Visit: Payer: Self-pay | Admitting: Oncology

## 2023-03-12 DIAGNOSIS — Z006 Encounter for examination for normal comparison and control in clinical research program: Secondary | ICD-10-CM

## 2023-03-16 ENCOUNTER — Other Ambulatory Visit (HOSPITAL_COMMUNITY): Payer: Self-pay

## 2023-04-01 ENCOUNTER — Other Ambulatory Visit (HOSPITAL_COMMUNITY): Payer: Self-pay

## 2023-04-06 ENCOUNTER — Other Ambulatory Visit (HOSPITAL_COMMUNITY): Payer: Self-pay

## 2023-04-13 ENCOUNTER — Other Ambulatory Visit (HOSPITAL_COMMUNITY): Payer: Self-pay

## 2023-04-15 ENCOUNTER — Other Ambulatory Visit (HOSPITAL_COMMUNITY): Payer: Self-pay

## 2023-04-15 MED ORDER — LINZESS 145 MCG PO CAPS
145.0000 ug | ORAL_CAPSULE | Freq: Every day | ORAL | 0 refills | Status: DC
  Filled 2023-04-15 – 2023-10-18 (×3): qty 90, 90d supply, fill #0

## 2023-04-22 ENCOUNTER — Other Ambulatory Visit (HOSPITAL_COMMUNITY): Payer: Self-pay

## 2023-05-03 ENCOUNTER — Other Ambulatory Visit (HOSPITAL_COMMUNITY): Payer: Self-pay

## 2023-05-07 DIAGNOSIS — E785 Hyperlipidemia, unspecified: Secondary | ICD-10-CM | POA: Diagnosis not present

## 2023-05-07 DIAGNOSIS — Z1389 Encounter for screening for other disorder: Secondary | ICD-10-CM | POA: Diagnosis not present

## 2023-05-07 DIAGNOSIS — Z1212 Encounter for screening for malignant neoplasm of rectum: Secondary | ICD-10-CM | POA: Diagnosis not present

## 2023-05-07 DIAGNOSIS — E611 Iron deficiency: Secondary | ICD-10-CM | POA: Diagnosis not present

## 2023-05-07 DIAGNOSIS — E669 Obesity, unspecified: Secondary | ICD-10-CM | POA: Diagnosis not present

## 2023-05-07 DIAGNOSIS — D649 Anemia, unspecified: Secondary | ICD-10-CM | POA: Diagnosis not present

## 2023-05-09 ENCOUNTER — Other Ambulatory Visit (HOSPITAL_COMMUNITY): Payer: Self-pay

## 2023-05-09 MED ORDER — ESZOPICLONE 3 MG PO TABS
3.0000 mg | ORAL_TABLET | Freq: Every evening | ORAL | 0 refills | Status: DC
  Filled 2023-05-09: qty 30, 30d supply, fill #0

## 2023-05-09 MED ORDER — AMPHETAMINE-DEXTROAMPHETAMINE 30 MG PO TABS
30.0000 mg | ORAL_TABLET | Freq: Two times a day (BID) | ORAL | 0 refills | Status: DC
  Filled 2023-05-09: qty 60, 30d supply, fill #0

## 2023-05-09 MED ORDER — HYDROXYZINE PAMOATE 50 MG PO CAPS
50.0000 mg | ORAL_CAPSULE | Freq: Every evening | ORAL | 3 refills | Status: DC | PRN
  Filled 2023-05-09: qty 30, 30d supply, fill #0
  Filled 2023-06-15 – 2023-07-08 (×2): qty 30, 30d supply, fill #1
  Filled 2023-08-21 (×2): qty 30, 30d supply, fill #2
  Filled 2023-10-29: qty 30, 30d supply, fill #3

## 2023-05-13 ENCOUNTER — Other Ambulatory Visit (HOSPITAL_COMMUNITY): Payer: Self-pay

## 2023-05-14 ENCOUNTER — Other Ambulatory Visit (HOSPITAL_COMMUNITY): Payer: Self-pay

## 2023-05-14 DIAGNOSIS — R82998 Other abnormal findings in urine: Secondary | ICD-10-CM | POA: Diagnosis not present

## 2023-05-14 DIAGNOSIS — E669 Obesity, unspecified: Secondary | ICD-10-CM | POA: Diagnosis not present

## 2023-05-14 DIAGNOSIS — J45909 Unspecified asthma, uncomplicated: Secondary | ICD-10-CM | POA: Diagnosis not present

## 2023-05-14 DIAGNOSIS — Z23 Encounter for immunization: Secondary | ICD-10-CM | POA: Diagnosis not present

## 2023-05-14 DIAGNOSIS — Z0000 Encounter for general adult medical examination without abnormal findings: Secondary | ICD-10-CM | POA: Diagnosis not present

## 2023-05-14 DIAGNOSIS — K219 Gastro-esophageal reflux disease without esophagitis: Secondary | ICD-10-CM | POA: Diagnosis not present

## 2023-05-14 DIAGNOSIS — D649 Anemia, unspecified: Secondary | ICD-10-CM | POA: Diagnosis not present

## 2023-05-14 DIAGNOSIS — K5900 Constipation, unspecified: Secondary | ICD-10-CM | POA: Diagnosis not present

## 2023-05-14 DIAGNOSIS — M25552 Pain in left hip: Secondary | ICD-10-CM | POA: Diagnosis not present

## 2023-05-14 DIAGNOSIS — E785 Hyperlipidemia, unspecified: Secondary | ICD-10-CM | POA: Diagnosis not present

## 2023-05-14 DIAGNOSIS — F909 Attention-deficit hyperactivity disorder, unspecified type: Secondary | ICD-10-CM | POA: Diagnosis not present

## 2023-05-14 MED ORDER — REPATHA 140 MG/ML ~~LOC~~ SOSY
140.0000 mg | PREFILLED_SYRINGE | SUBCUTANEOUS | 5 refills | Status: DC
  Filled 2023-05-14 – 2023-05-15 (×2): qty 2, 28d supply, fill #0
  Filled 2023-07-08: qty 2, 28d supply, fill #1
  Filled 2024-01-02: qty 2, 28d supply, fill #2

## 2023-05-15 ENCOUNTER — Other Ambulatory Visit (HOSPITAL_COMMUNITY): Payer: Self-pay

## 2023-05-21 ENCOUNTER — Other Ambulatory Visit (HOSPITAL_COMMUNITY): Payer: Self-pay

## 2023-05-22 DIAGNOSIS — M25562 Pain in left knee: Secondary | ICD-10-CM | POA: Diagnosis not present

## 2023-05-23 DIAGNOSIS — M222X2 Patellofemoral disorders, left knee: Secondary | ICD-10-CM | POA: Diagnosis not present

## 2023-05-24 ENCOUNTER — Other Ambulatory Visit (HOSPITAL_COMMUNITY): Payer: Self-pay

## 2023-05-25 ENCOUNTER — Other Ambulatory Visit (HOSPITAL_COMMUNITY): Payer: Self-pay

## 2023-05-30 ENCOUNTER — Other Ambulatory Visit: Payer: Self-pay

## 2023-05-31 DIAGNOSIS — M222X2 Patellofemoral disorders, left knee: Secondary | ICD-10-CM | POA: Diagnosis not present

## 2023-06-03 ENCOUNTER — Other Ambulatory Visit (HOSPITAL_COMMUNITY): Payer: Self-pay

## 2023-06-03 DIAGNOSIS — F4323 Adjustment disorder with mixed anxiety and depressed mood: Secondary | ICD-10-CM | POA: Diagnosis not present

## 2023-06-03 DIAGNOSIS — F900 Attention-deficit hyperactivity disorder, predominantly inattentive type: Secondary | ICD-10-CM | POA: Diagnosis not present

## 2023-06-03 DIAGNOSIS — G4700 Insomnia, unspecified: Secondary | ICD-10-CM | POA: Diagnosis not present

## 2023-06-03 MED ORDER — AMPHETAMINE-DEXTROAMPHETAMINE 30 MG PO TABS: 30.0000 mg | ORAL_TABLET | Freq: Two times a day (BID) | ORAL | 0 refills | Status: DC

## 2023-06-03 MED ORDER — AMPHETAMINE-DEXTROAMPHETAMINE 30 MG PO TABS
30.0000 mg | ORAL_TABLET | Freq: Two times a day (BID) | ORAL | 0 refills | Status: DC
  Filled 2023-10-18: qty 60, 30d supply, fill #0

## 2023-06-03 MED ORDER — AMPHETAMINE-DEXTROAMPHETAMINE 30 MG PO TABS
30.0000 mg | ORAL_TABLET | Freq: Two times a day (BID) | ORAL | 0 refills | Status: DC
  Filled 2023-07-08: qty 60, 30d supply, fill #0

## 2023-06-03 MED ORDER — ESZOPICLONE 3 MG PO TABS
3.0000 mg | ORAL_TABLET | Freq: Every day | ORAL | 3 refills | Status: DC
  Filled 2023-06-03 – 2023-06-15 (×2): qty 30, 30d supply, fill #0
  Filled 2023-07-13: qty 30, 30d supply, fill #1
  Filled 2023-08-21 (×2): qty 30, 30d supply, fill #2
  Filled 2023-10-03: qty 30, 30d supply, fill #3

## 2023-06-05 ENCOUNTER — Other Ambulatory Visit (HOSPITAL_COMMUNITY): Payer: Self-pay

## 2023-06-05 DIAGNOSIS — M222X2 Patellofemoral disorders, left knee: Secondary | ICD-10-CM | POA: Diagnosis not present

## 2023-06-07 DIAGNOSIS — M222X2 Patellofemoral disorders, left knee: Secondary | ICD-10-CM | POA: Diagnosis not present

## 2023-06-12 DIAGNOSIS — M25562 Pain in left knee: Secondary | ICD-10-CM | POA: Diagnosis not present

## 2023-06-12 DIAGNOSIS — M222X2 Patellofemoral disorders, left knee: Secondary | ICD-10-CM | POA: Diagnosis not present

## 2023-06-15 ENCOUNTER — Other Ambulatory Visit (HOSPITAL_COMMUNITY): Payer: Self-pay

## 2023-06-17 ENCOUNTER — Other Ambulatory Visit: Payer: Self-pay

## 2023-06-25 ENCOUNTER — Other Ambulatory Visit (HOSPITAL_COMMUNITY): Payer: Self-pay

## 2023-06-27 DIAGNOSIS — M222X2 Patellofemoral disorders, left knee: Secondary | ICD-10-CM | POA: Diagnosis not present

## 2023-07-08 ENCOUNTER — Other Ambulatory Visit (HOSPITAL_COMMUNITY): Payer: Self-pay

## 2023-07-08 ENCOUNTER — Other Ambulatory Visit: Payer: Self-pay

## 2023-07-09 ENCOUNTER — Other Ambulatory Visit (HOSPITAL_COMMUNITY): Payer: Self-pay

## 2023-07-09 MED ORDER — PANTOPRAZOLE SODIUM 40 MG PO TBEC
40.0000 mg | DELAYED_RELEASE_TABLET | Freq: Every day | ORAL | 3 refills | Status: AC
  Filled 2023-07-09: qty 90, 90d supply, fill #0
  Filled 2023-09-20: qty 90, 90d supply, fill #1
  Filled 2024-01-02: qty 90, 90d supply, fill #2
  Filled 2024-06-25: qty 90, 90d supply, fill #3

## 2023-07-09 MED ORDER — ROSUVASTATIN CALCIUM 10 MG PO TABS
10.0000 mg | ORAL_TABLET | Freq: Every day | ORAL | 3 refills | Status: DC
  Filled 2023-07-09: qty 90, 90d supply, fill #0

## 2023-07-11 ENCOUNTER — Other Ambulatory Visit (HOSPITAL_COMMUNITY): Payer: Self-pay

## 2023-07-15 ENCOUNTER — Other Ambulatory Visit: Payer: Self-pay

## 2023-07-19 ENCOUNTER — Other Ambulatory Visit (HOSPITAL_COMMUNITY): Payer: Self-pay

## 2023-07-25 ENCOUNTER — Other Ambulatory Visit (HOSPITAL_COMMUNITY): Payer: Self-pay

## 2023-07-25 MED ORDER — REPATHA PUSHTRONEX SYSTEM 420 MG/3.5ML ~~LOC~~ SOCT
SUBCUTANEOUS | 3 refills | Status: AC
  Filled 2023-07-25: qty 7, 28d supply, fill #0

## 2023-07-25 MED ORDER — REPATHA SURECLICK 140 MG/ML ~~LOC~~ SOAJ
140.0000 mg | SUBCUTANEOUS | 3 refills | Status: DC
  Filled 2023-07-25: qty 2, 28d supply, fill #0
  Filled 2023-09-20: qty 2, 28d supply, fill #1
  Filled 2023-10-29: qty 2, 28d supply, fill #2
  Filled 2023-11-27: qty 2, 28d supply, fill #3

## 2023-07-26 ENCOUNTER — Other Ambulatory Visit (HOSPITAL_COMMUNITY): Payer: Self-pay

## 2023-08-03 ENCOUNTER — Other Ambulatory Visit (HOSPITAL_COMMUNITY): Payer: Self-pay

## 2023-08-12 ENCOUNTER — Other Ambulatory Visit: Payer: Self-pay | Admitting: Adult Health

## 2023-08-12 DIAGNOSIS — E669 Obesity, unspecified: Secondary | ICD-10-CM | POA: Diagnosis not present

## 2023-08-12 DIAGNOSIS — E785 Hyperlipidemia, unspecified: Secondary | ICD-10-CM | POA: Diagnosis not present

## 2023-08-12 DIAGNOSIS — M1990 Unspecified osteoarthritis, unspecified site: Secondary | ICD-10-CM | POA: Diagnosis not present

## 2023-08-12 DIAGNOSIS — M25561 Pain in right knee: Secondary | ICD-10-CM | POA: Diagnosis not present

## 2023-08-19 ENCOUNTER — Other Ambulatory Visit: Payer: Self-pay | Admitting: Internal Medicine

## 2023-08-19 DIAGNOSIS — Z1231 Encounter for screening mammogram for malignant neoplasm of breast: Secondary | ICD-10-CM

## 2023-08-21 ENCOUNTER — Other Ambulatory Visit: Payer: Self-pay

## 2023-08-21 ENCOUNTER — Other Ambulatory Visit (HOSPITAL_COMMUNITY): Payer: Self-pay

## 2023-09-10 ENCOUNTER — Ambulatory Visit
Admission: RE | Admit: 2023-09-10 | Discharge: 2023-09-10 | Disposition: A | Discharge status: Home / Self Care | Payer: No Typology Code available for payment source | Source: Ambulatory Visit | Attending: Adult Health | Admitting: Adult Health

## 2023-09-10 DIAGNOSIS — E785 Hyperlipidemia, unspecified: Secondary | ICD-10-CM

## 2023-09-20 ENCOUNTER — Other Ambulatory Visit (HOSPITAL_COMMUNITY): Payer: Self-pay

## 2023-09-20 ENCOUNTER — Ambulatory Visit
Admission: RE | Admit: 2023-09-20 | Discharge: 2023-09-20 | Disposition: A | Discharge status: Home / Self Care | Payer: Commercial Managed Care - PPO | Source: Ambulatory Visit | Attending: Internal Medicine | Admitting: Internal Medicine

## 2023-09-20 ENCOUNTER — Other Ambulatory Visit (HOSPITAL_BASED_OUTPATIENT_CLINIC_OR_DEPARTMENT_OTHER): Payer: Self-pay

## 2023-09-20 ENCOUNTER — Other Ambulatory Visit: Payer: Self-pay

## 2023-09-20 DIAGNOSIS — Z1231 Encounter for screening mammogram for malignant neoplasm of breast: Secondary | ICD-10-CM

## 2023-10-01 ENCOUNTER — Other Ambulatory Visit (HOSPITAL_COMMUNITY): Payer: Self-pay

## 2023-10-01 DIAGNOSIS — L6689 Other cicatricial alopecia: Secondary | ICD-10-CM | POA: Diagnosis not present

## 2023-10-01 DIAGNOSIS — L282 Other prurigo: Secondary | ICD-10-CM | POA: Diagnosis not present

## 2023-10-01 DIAGNOSIS — L219 Seborrheic dermatitis, unspecified: Secondary | ICD-10-CM | POA: Diagnosis not present

## 2023-10-01 MED ORDER — FLUOCINOLONE ACETONIDE SCALP 0.01 % EX OIL
1.0000 mL | TOPICAL_OIL | CUTANEOUS | 2 refills | Status: AC
  Filled 2023-10-01: qty 118.28, 30d supply, fill #0

## 2023-10-03 ENCOUNTER — Other Ambulatory Visit: Payer: Self-pay

## 2023-10-03 ENCOUNTER — Other Ambulatory Visit (HOSPITAL_COMMUNITY): Payer: Self-pay

## 2023-10-04 IMAGING — MG MM DIGITAL SCREENING BILAT W/ TOMO AND CAD
8 series · 8 of 24 positions shown · non-contrast
Comparison: Previous exam(s).

CLINICAL DATA: Screening.

EXAM:
DIGITAL SCREENING BILATERAL MAMMOGRAM WITH TOMOSYNTHESIS AND CAD
TECHNIQUE: Bilateral screening digital craniocaudal and mediolateral oblique
mammograms were obtained. Bilateral screening digital breast
tomosynthesis was performed. The images were evaluated with
computer-aided detection.

[L MLO synth-2D]
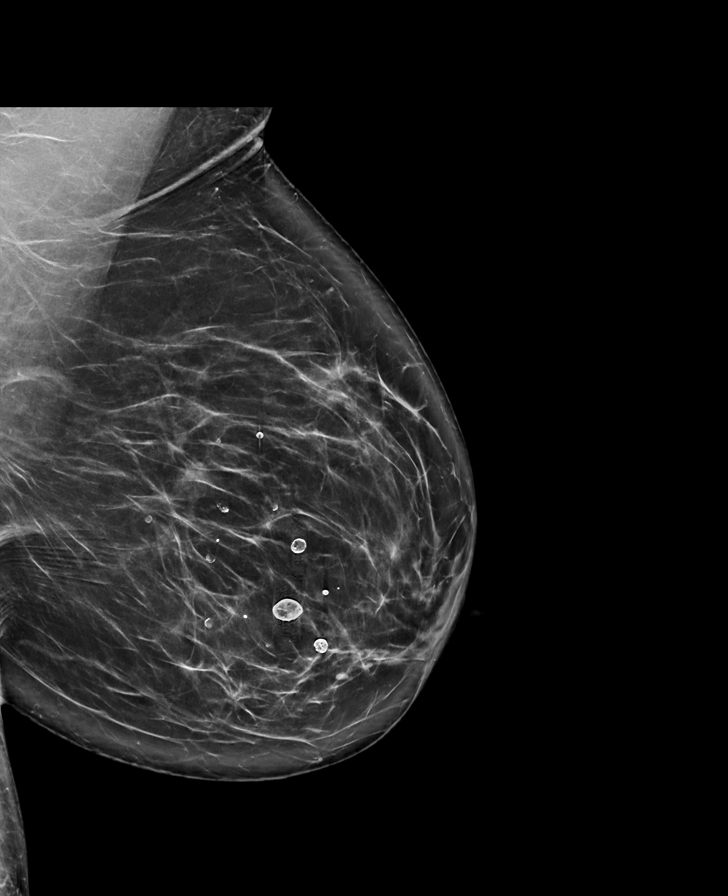

[R MLO synth-2D]
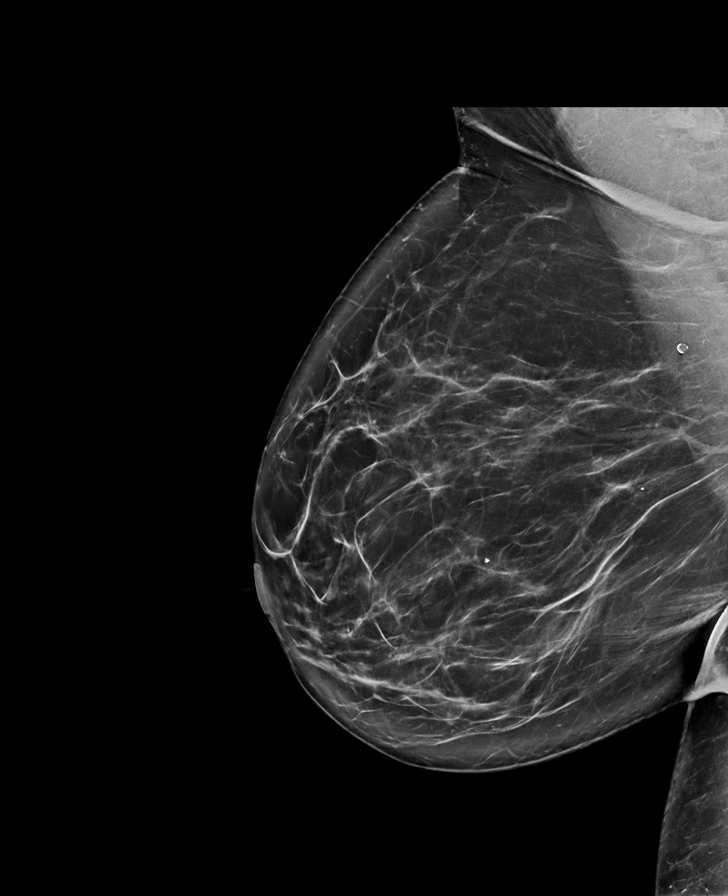

[L CC synth-2D]
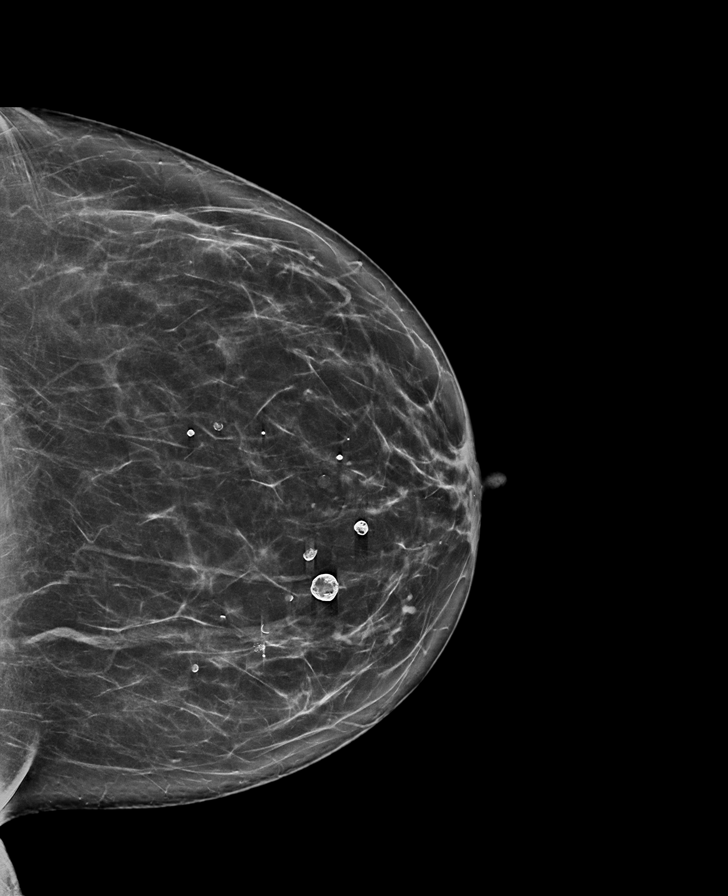

[R CC synth-2D]
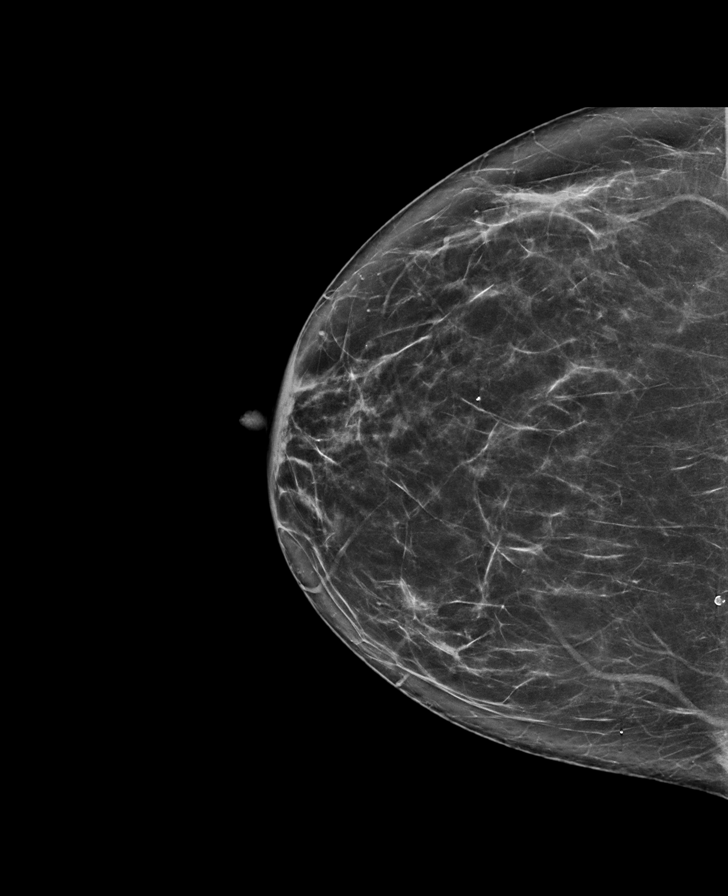

[L MLO tomo · tomo slice 48/95.0]
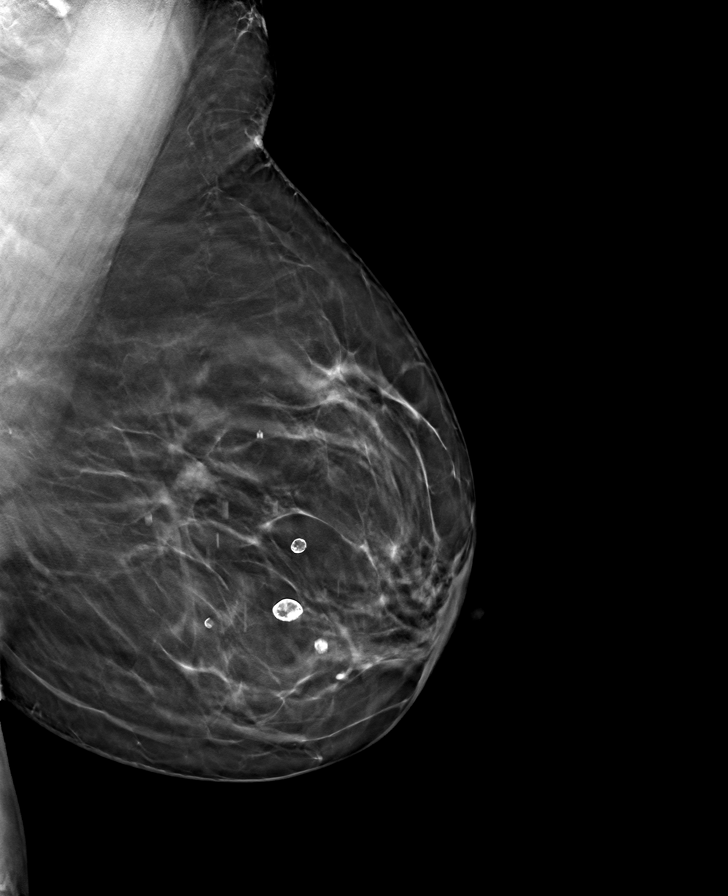

[R MLO tomo · tomo slice 48/95.0]
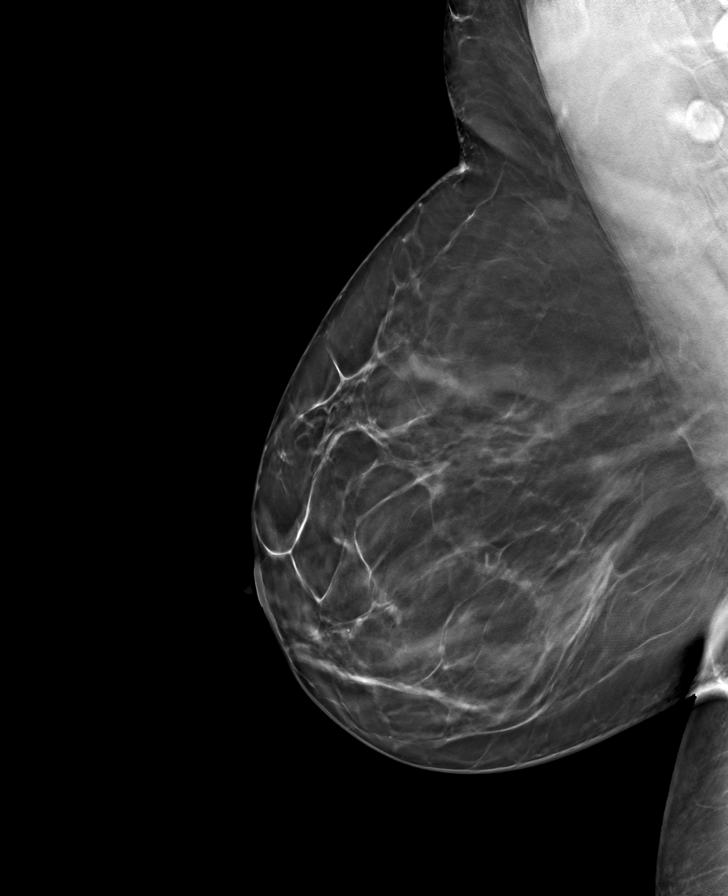

[L CC tomo · tomo slice 41/81.0]
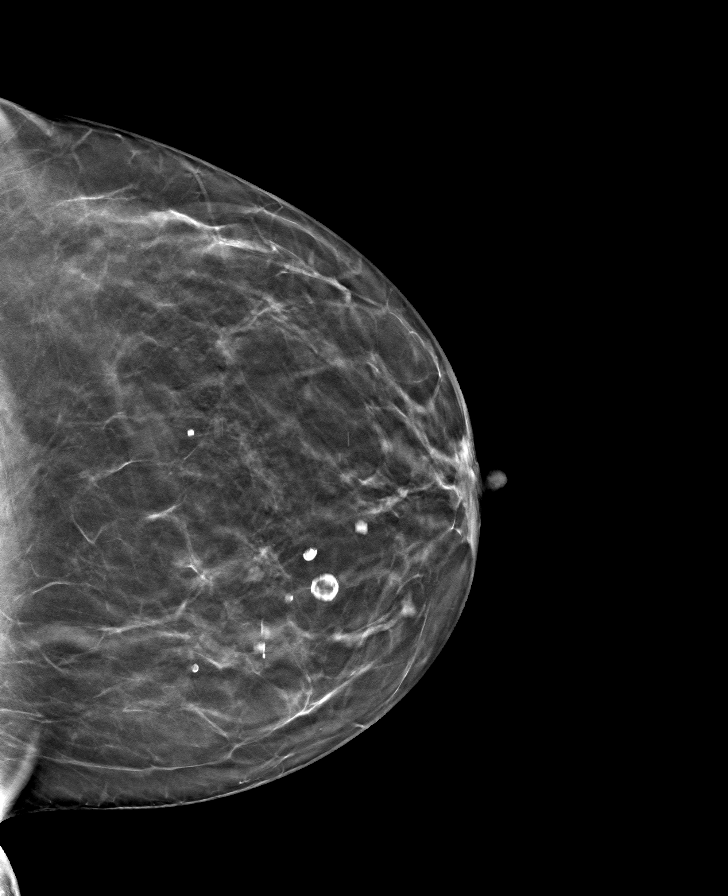

[R CC tomo · tomo slice 39/78.0]
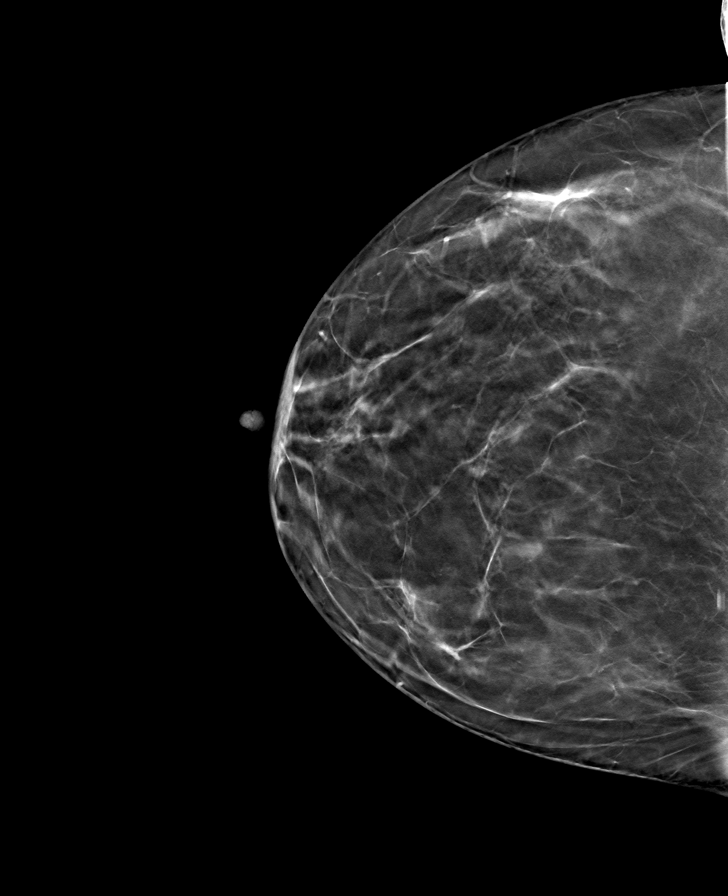

[8 of 24 positions shown; findings below may reference images not displayed]

ACR Breast Density Category b: There are scattered areas of
fibroglandular density.
FINDINGS: There are no findings suspicious for malignancy.
IMPRESSION: No mammographic evidence of malignancy. A result letter of this
screening mammogram will be mailed directly to the patient.

RECOMMENDATION:
Screening mammogram in one year. (Code:51-O-LD2)

BI-RADS CATEGORY  1: Negative.

## 2023-10-14 ENCOUNTER — Other Ambulatory Visit (HOSPITAL_COMMUNITY): Payer: Self-pay

## 2023-10-18 ENCOUNTER — Other Ambulatory Visit (HOSPITAL_COMMUNITY): Payer: Self-pay

## 2023-10-22 ENCOUNTER — Other Ambulatory Visit (HOSPITAL_COMMUNITY): Payer: Self-pay

## 2023-10-22 DIAGNOSIS — E785 Hyperlipidemia, unspecified: Secondary | ICD-10-CM | POA: Diagnosis not present

## 2023-10-22 DIAGNOSIS — T466X5A Adverse effect of antihyperlipidemic and antiarteriosclerotic drugs, initial encounter: Secondary | ICD-10-CM | POA: Diagnosis not present

## 2023-10-22 DIAGNOSIS — I2510 Atherosclerotic heart disease of native coronary artery without angina pectoris: Secondary | ICD-10-CM | POA: Diagnosis not present

## 2023-10-22 DIAGNOSIS — M791 Myalgia, unspecified site: Secondary | ICD-10-CM | POA: Diagnosis not present

## 2023-10-22 MED ORDER — WEGOVY 1 MG/0.5ML ~~LOC~~ SOAJ: 1.0000 mg | SUBCUTANEOUS | 0 refills | Status: DC

## 2023-10-22 MED ORDER — WEGOVY 0.5 MG/0.5ML ~~LOC~~ SOAJ
0.5000 mg | SUBCUTANEOUS | 0 refills | Status: DC
  Filled 2023-10-22 – 2023-11-07 (×2): qty 2, 28d supply, fill #0

## 2023-10-22 MED ORDER — WEGOVY 1.7 MG/0.75ML ~~LOC~~ SOAJ
1.7000 mg | SUBCUTANEOUS | 3 refills | Status: DC
  Filled 2023-10-22: qty 3, 28d supply, fill #0

## 2023-10-22 MED ORDER — NEXLIZET 180-10 MG PO TABS
1.0000 | ORAL_TABLET | Freq: Every day | ORAL | 3 refills | Status: AC
  Filled 2023-10-22 – 2023-10-23 (×3): qty 90, 90d supply, fill #0
  Filled 2024-01-24: qty 90, 90d supply, fill #1

## 2023-10-23 ENCOUNTER — Other Ambulatory Visit (HOSPITAL_COMMUNITY): Payer: Self-pay

## 2023-10-29 ENCOUNTER — Other Ambulatory Visit (HOSPITAL_COMMUNITY): Payer: Self-pay

## 2023-11-04 ENCOUNTER — Other Ambulatory Visit (HOSPITAL_COMMUNITY): Payer: Self-pay

## 2023-11-04 DIAGNOSIS — F4323 Adjustment disorder with mixed anxiety and depressed mood: Secondary | ICD-10-CM | POA: Diagnosis not present

## 2023-11-04 DIAGNOSIS — F900 Attention-deficit hyperactivity disorder, predominantly inattentive type: Secondary | ICD-10-CM | POA: Diagnosis not present

## 2023-11-04 DIAGNOSIS — G4700 Insomnia, unspecified: Secondary | ICD-10-CM | POA: Diagnosis not present

## 2023-11-04 MED ORDER — AMPHETAMINE-DEXTROAMPHETAMINE 30 MG PO TABS
30.0000 mg | ORAL_TABLET | Freq: Two times a day (BID) | ORAL | 0 refills | Status: DC
  Filled 2024-04-09: qty 60, 30d supply, fill #0

## 2023-11-04 MED ORDER — ESZOPICLONE 3 MG PO TABS
3.0000 mg | ORAL_TABLET | Freq: Every day | ORAL | 2 refills | Status: DC
  Filled 2023-11-04: qty 30, 30d supply, fill #0
  Filled 2023-12-13: qty 30, 30d supply, fill #1
  Filled 2024-01-24 (×2): qty 30, 30d supply, fill #2

## 2023-11-04 MED ORDER — AMPHETAMINE-DEXTROAMPHETAMINE 30 MG PO TABS: 30.0000 mg | ORAL_TABLET | Freq: Two times a day (BID) | ORAL | 0 refills | Status: DC

## 2023-11-04 MED ORDER — HYDROXYZINE PAMOATE 50 MG PO CAPS
50.0000 mg | ORAL_CAPSULE | Freq: Every evening | ORAL | 5 refills | Status: DC | PRN
  Filled 2023-12-13: qty 30, 30d supply, fill #0
  Filled 2024-01-24 (×2): qty 30, 30d supply, fill #1
  Filled 2024-03-04: qty 30, 30d supply, fill #2
  Filled 2024-04-09 – 2024-04-10 (×2): qty 30, 30d supply, fill #3
  Filled 2024-05-15: qty 30, 30d supply, fill #4
  Filled 2024-06-25: qty 30, 30d supply, fill #5

## 2023-11-04 MED ORDER — AMPHETAMINE-DEXTROAMPHETAMINE 30 MG PO TABS
30.0000 mg | ORAL_TABLET | Freq: Two times a day (BID) | ORAL | 0 refills | Status: AC
Start: 2023-12-03 — End: ?
  Filled 2024-01-02: qty 60, 30d supply, fill #0

## 2023-11-07 ENCOUNTER — Other Ambulatory Visit (HOSPITAL_COMMUNITY): Payer: Self-pay

## 2023-11-21 DIAGNOSIS — L6689 Other cicatricial alopecia: Secondary | ICD-10-CM | POA: Diagnosis not present

## 2023-11-21 DIAGNOSIS — L219 Seborrheic dermatitis, unspecified: Secondary | ICD-10-CM | POA: Diagnosis not present

## 2023-11-27 ENCOUNTER — Other Ambulatory Visit (HOSPITAL_COMMUNITY): Payer: Self-pay

## 2023-12-13 ENCOUNTER — Other Ambulatory Visit: Payer: Self-pay

## 2024-01-02 ENCOUNTER — Other Ambulatory Visit (HOSPITAL_COMMUNITY): Payer: Self-pay

## 2024-01-03 ENCOUNTER — Other Ambulatory Visit (HOSPITAL_COMMUNITY): Payer: Self-pay

## 2024-01-03 MED ORDER — MELOXICAM 15 MG PO TABS
15.0000 mg | ORAL_TABLET | Freq: Every day | ORAL | 2 refills | Status: AC | PRN
  Filled 2024-01-03: qty 90, 90d supply, fill #0

## 2024-01-04 ENCOUNTER — Other Ambulatory Visit (HOSPITAL_COMMUNITY): Payer: Self-pay

## 2024-01-06 ENCOUNTER — Other Ambulatory Visit (HOSPITAL_COMMUNITY): Payer: Self-pay

## 2024-01-06 ENCOUNTER — Other Ambulatory Visit (HOSPITAL_BASED_OUTPATIENT_CLINIC_OR_DEPARTMENT_OTHER): Payer: Self-pay

## 2024-01-06 MED ORDER — REPATHA SURECLICK 140 MG/ML ~~LOC~~ SOAJ
140.0000 mg | SUBCUTANEOUS | 3 refills | Status: DC
  Filled 2024-01-06: qty 2, 28d supply, fill #0
  Filled 2024-03-12: qty 2, 28d supply, fill #1
  Filled 2024-04-09 – 2024-04-10 (×2): qty 2, 28d supply, fill #2
  Filled 2024-05-15: qty 2, 28d supply, fill #3

## 2024-01-11 ENCOUNTER — Other Ambulatory Visit (HOSPITAL_COMMUNITY): Payer: Self-pay

## 2024-01-24 ENCOUNTER — Other Ambulatory Visit (HOSPITAL_COMMUNITY): Payer: Self-pay

## 2024-01-24 ENCOUNTER — Other Ambulatory Visit: Payer: Self-pay

## 2024-01-29 ENCOUNTER — Other Ambulatory Visit (HOSPITAL_COMMUNITY): Payer: Self-pay

## 2024-03-04 ENCOUNTER — Other Ambulatory Visit (HOSPITAL_COMMUNITY): Payer: Self-pay

## 2024-03-06 ENCOUNTER — Other Ambulatory Visit (HOSPITAL_COMMUNITY): Payer: Self-pay

## 2024-03-07 ENCOUNTER — Other Ambulatory Visit (HOSPITAL_COMMUNITY): Payer: Self-pay

## 2024-03-07 MED ORDER — ESZOPICLONE 3 MG PO TABS
3.0000 mg | ORAL_TABLET | Freq: Every day | ORAL | 2 refills | Status: DC
  Filled 2024-03-07: qty 30, 30d supply, fill #0
  Filled 2024-04-09 – 2024-04-10 (×2): qty 30, 30d supply, fill #1
  Filled 2024-05-15: qty 30, 30d supply, fill #2

## 2024-03-24 ENCOUNTER — Other Ambulatory Visit (HOSPITAL_BASED_OUTPATIENT_CLINIC_OR_DEPARTMENT_OTHER): Payer: Self-pay

## 2024-03-24 ENCOUNTER — Other Ambulatory Visit (HOSPITAL_COMMUNITY): Payer: Self-pay

## 2024-03-24 MED ORDER — AMOXICILLIN 500 MG PO CAPS
500.0000 mg | ORAL_CAPSULE | Freq: Three times a day (TID) | ORAL | 0 refills | Status: DC
  Filled 2024-03-24: qty 21, 7d supply, fill #0

## 2024-04-09 ENCOUNTER — Other Ambulatory Visit (HOSPITAL_COMMUNITY): Payer: Self-pay

## 2024-04-10 ENCOUNTER — Other Ambulatory Visit (HOSPITAL_COMMUNITY): Payer: Self-pay

## 2024-04-10 ENCOUNTER — Other Ambulatory Visit: Payer: Self-pay

## 2024-05-01 ENCOUNTER — Encounter (HOSPITAL_BASED_OUTPATIENT_CLINIC_OR_DEPARTMENT_OTHER): Payer: Self-pay

## 2024-05-01 ENCOUNTER — Encounter (HOSPITAL_BASED_OUTPATIENT_CLINIC_OR_DEPARTMENT_OTHER): Admitting: Obstetrics & Gynecology

## 2024-05-01 NOTE — Progress Notes (Deleted)
   ANNUAL EXAM Patient name: Terry Salazar MRN [id number]  Date of birth: 20-May-1961 Chief Complaint:   No chief complaint on file.  History of Present Illness:   Terry Salazar is a 63 y.o. G29P1020 African-American female being seen today for a routine annual exam.  Current complaints: ***  Patient's last menstrual period was 03/06/2008.   The pregnancy intention screening data noted above was reviewed. Potential methods of contraception were discussed. The patient elected to proceed with No data recorded.   Last pap 12/07/2016. Results were: {Pap findings:25134}. H/O abnormal pap: {yes/yes***/no:23866} Last mammogram: 09/20/2023. Results were: normal. Family h/o breast cancer: {yes***/no:23838} Last colonoscopy: 01/15/2019. Results were: normal. Family h/o colorectal cancer: {yes***/no:23838}     05/10/2016   10:05 AM 07/05/2015    4:14 PM 02/15/2015    8:11 AM  Depression screen PHQ 2/9  Decreased Interest 0 0 0  Down, Depressed, Hopeless 0 0 0  PHQ - 2 Score 0 0 0         No data to display           Review of Systems:   Pertinent items are noted in HPI Denies any headaches, blurred vision, fatigue, shortness of breath, chest pain, abdominal pain, abnormal vaginal discharge/itching/odor/irritation, problems with periods, bowel movements, urination, or intercourse unless otherwise stated above. Pertinent History Reviewed:  Reviewed past medical,surgical, social and family history.  Reviewed problem list, medications and allergies. Physical Assessment:  There were no vitals filed for this visit.There is no height or weight on file to calculate BMI.        Physical Examination:   General appearance - well appearing, and in no distress  Mental status - alert, oriented to person, place, and time  Psych:  She has a normal mood and affect  Skin - warm and dry, normal color, no suspicious lesions noted  Chest - effort normal, all lung fields clear to auscultation  bilaterally  Heart - normal rate and regular rhythm  Neck:  midline trachea, no thyromegaly or nodules  Breasts - breasts appear normal, no suspicious masses, no skin or nipple changes or  axillary nodes  Abdomen - soft, nontender, nondistended, no masses or organomegaly  Pelvic - VULVA: normal appearing vulva with no masses, tenderness or lesions  VAGINA: normal appearing vagina with normal color and discharge, no lesions  CERVIX: normal appearing cervix without discharge or lesions, no CMT  Thin prep pap is {Desc; done/not:10129} *** HR HPV cotesting  UTERUS: uterus is felt to be normal size, shape, consistency and nontender   ADNEXA: No adnexal masses or tenderness noted.  Rectal - normal rectal, good sphincter tone, no masses felt. Hemoccult: ***  Extremities:  No swelling or varicosities noted  Chaperone present for exam  No results found for this or any previous visit (from the past 24 hours).  Assessment & Plan:  1) Well-Woman Exam  2) ***  Labs/procedures today: ***  Mammogram: {Mammo f/u:25212::@ 63yo}, or sooner if problems Colonoscopy: {TCS f/u:25213::@ 63yo}, or sooner if problems  No orders of the defined types were placed in this encounter.   Meds: No orders of the defined types were placed in this encounter.   Follow-up: No follow-ups on file.  Terry LOISE Quale, RN 05/01/2024 8:01 AM

## 2024-05-15 ENCOUNTER — Other Ambulatory Visit: Payer: Self-pay

## 2024-05-29 ENCOUNTER — Other Ambulatory Visit: Payer: Self-pay | Admitting: Medical Genetics

## 2024-05-29 DIAGNOSIS — Z006 Encounter for examination for normal comparison and control in clinical research program: Secondary | ICD-10-CM

## 2024-06-01 ENCOUNTER — Ambulatory Visit (HOSPITAL_BASED_OUTPATIENT_CLINIC_OR_DEPARTMENT_OTHER): Admitting: Obstetrics & Gynecology

## 2024-06-01 ENCOUNTER — Encounter (HOSPITAL_BASED_OUTPATIENT_CLINIC_OR_DEPARTMENT_OTHER): Payer: Self-pay | Admitting: Obstetrics & Gynecology

## 2024-06-01 VITALS — BP 131/86 | HR 66 | Ht 71.0 in | Wt 183.0 lb

## 2024-06-01 DIAGNOSIS — B009 Herpesviral infection, unspecified: Secondary | ICD-10-CM

## 2024-06-01 DIAGNOSIS — Z01419 Encounter for gynecological examination (general) (routine) without abnormal findings: Secondary | ICD-10-CM

## 2024-06-01 DIAGNOSIS — Z90710 Acquired absence of both cervix and uterus: Secondary | ICD-10-CM | POA: Diagnosis not present

## 2024-06-01 DIAGNOSIS — Z9884 Bariatric surgery status: Secondary | ICD-10-CM | POA: Diagnosis not present

## 2024-06-01 DIAGNOSIS — E2839 Other primary ovarian failure: Secondary | ICD-10-CM

## 2024-06-01 DIAGNOSIS — Z1331 Encounter for screening for depression: Secondary | ICD-10-CM | POA: Diagnosis not present

## 2024-06-01 MED ORDER — LINACLOTIDE 145 MCG PO CAPS: 145.0000 ug | ORAL_CAPSULE | Freq: Every day | ORAL | Status: AC

## 2024-06-01 NOTE — Progress Notes (Signed)
   ANNUAL EXAM Patient name: Terry Salazar MRN [id number]  Date of birth: 11/12/1960 Chief Complaint:   Re-establishing care  History of Present Illness:   Terry Salazar is a 63 y.o. G19P1020 African-American female being seen today for a routine annual exam.   Doing well.  Hasn't been seen since 2020.  H/o hysterectomy 2009.    Coronary calcium  score done earlier this year.  Crestor  caused muscle cramping.  On Repatha  and Zetia .   Patient's last menstrual period was 03/06/2008.   Last pap: Hysterectomy. H/O abnormal pap: yes Last mammogram: 09/20/2023. Results were: normal. Family h/o breast cancer: maternal grandmother. Last colonoscopy: 01/15/2019. Results were: normal. Family h/o colorectal cancer: yes mother. Dexa:  ordered for pt     06/01/2024   11:12 AM 05/10/2016   10:05 AM 07/05/2015    4:14 PM 02/15/2015    8:11 AM  Depression screen PHQ 2/9  Decreased Interest 0 0 0 0  Down, Depressed, Hopeless 0 0 0 0  PHQ - 2 Score 0 0 0 0     Review of Systems:   Pertinent items are noted in HPI Denies any pelvic pain and urinary changes.  Stable bowel function.   Pertinent History Reviewed:  Reviewed past medical,surgical, social and family history.  Reviewed problem list, medications and allergies. Physical Assessment:   Vitals:   06/01/24 1059  BP: 131/86  Pulse: 66  SpO2: 100%  Weight: 183 lb (83 kg)  Height: 5' 11 (1.803 m)  Body mass index is 25.52 kg/m.        Physical Examination:   General appearance - well appearing, and in no distress  Mental status - alert, oriented to person, place, and time  Psych:  She has a normal mood and affect  Skin - warm and dry, normal color, no suspicious lesions noted  Chest - effort normal, all lung fields clear to auscultation bilaterally  Heart - normal rate and regular rhythm  Neck:  midline trachea, no thyromegaly or nodules  Breasts - breasts appear normal, no suspicious masses, no skin or nipple changes or  axillary  nodes  Abdomen - soft, nontender, nondistended, no masses or organomegaly  Pelvic - VULVA: normal appearing vulva with no masses, tenderness or lesions   VAGINA: normal appearing vagina with normal color and discharge, no lesions   CERVIX: surgically absent  Thin prep pap is not indicated  UTERUS: surgically absent  ADNEXA: No adnexal masses or tenderness noted.  Rectal - normal rectal, good sphincter tone, no masses felt.   Extremities:  No swelling or varicosities noted  Chaperone present for exam  No results found for this or any previous visit (from the past 24 hours).  Assessment & Plan:  1. Well woman exam with routine gynecological exam (Primary) - Pap smear not indicated - Mammogram 09/2023 - Colonoscopy 2020.  Follow up 10 years. - Bone mineral density ordered - lab work done with PCP, Dr. Janey - vaccines reviewed/updated  2. Hypoestrogenism - DG Bone Density; Future  3. S/P laparoscopic sleeve gastrectomy  4. H/O: hysterectomy   Orders Placed This Encounter  Procedures   DG Bone Density    Meds:  Meds ordered this encounter  Medications   linaclotide  (LINZESS ) 145 MCG CAPS capsule    Sig: Take 1 capsule by mouth once a day     Ronal GORMAN Pinal, MD 06/01/2024 11:40 AM

## 2024-06-01 NOTE — Patient Instructions (Addendum)
 [facility] [address], [address] [facility] [place],  [place]  [address]  Main: [phone]  _______________________________________________________________________________________________________________  Reesa Justin at [email or web address] or [phone] for inquiries about the research program or consent form.

## 2024-06-25 ENCOUNTER — Other Ambulatory Visit (HOSPITAL_COMMUNITY): Payer: Self-pay

## 2024-06-25 ENCOUNTER — Other Ambulatory Visit: Payer: Self-pay

## 2024-06-25 MED ORDER — LINZESS 145 MCG PO CAPS
145.0000 ug | ORAL_CAPSULE | Freq: Every day | ORAL | 0 refills | Status: AC
  Filled 2024-06-25: qty 90, 90d supply, fill #0

## 2024-06-25 MED ORDER — REPATHA SURECLICK 140 MG/ML ~~LOC~~ SOAJ
140.0000 mg | SUBCUTANEOUS | 3 refills | Status: AC
  Filled 2024-06-25: qty 2, 28d supply, fill #0
  Filled 2024-07-21: qty 2, 28d supply, fill #1
  Filled 2024-08-14: qty 2, 28d supply, fill #2

## 2024-06-26 ENCOUNTER — Other Ambulatory Visit (HOSPITAL_COMMUNITY): Payer: Self-pay

## 2024-07-03 ENCOUNTER — Other Ambulatory Visit (HOSPITAL_COMMUNITY): Payer: Self-pay

## 2024-07-07 ENCOUNTER — Other Ambulatory Visit (HOSPITAL_COMMUNITY): Payer: Self-pay

## 2024-07-07 MED ORDER — ESZOPICLONE 3 MG PO TABS
3.0000 mg | ORAL_TABLET | Freq: Every evening | ORAL | 2 refills | Status: AC | PRN
  Filled 2024-07-07: qty 30, 30d supply, fill #0

## 2024-07-22 ENCOUNTER — Other Ambulatory Visit (HOSPITAL_COMMUNITY): Payer: Self-pay

## 2024-08-12 ENCOUNTER — Other Ambulatory Visit (HOSPITAL_COMMUNITY): Payer: Self-pay

## 2024-08-12 MED ORDER — HYDROXYZINE PAMOATE 50 MG PO CAPS
50.0000 mg | ORAL_CAPSULE | Freq: Every evening | ORAL | 5 refills | Status: AC | PRN
  Filled 2024-08-12: qty 30, 30d supply, fill #0

## 2024-08-12 MED ORDER — ESZOPICLONE 3 MG PO TABS
3.0000 mg | ORAL_TABLET | Freq: Every day | ORAL | 3 refills | Status: AC
  Filled 2024-08-12: qty 30, 30d supply, fill #0

## 2024-08-12 MED ORDER — AMPHETAMINE-DEXTROAMPHETAMINE 30 MG PO TABS: 30.0000 mg | ORAL_TABLET | Freq: Two times a day (BID) | ORAL | 0 refills | Status: AC

## 2024-08-12 MED ORDER — AMPHETAMINE-DEXTROAMPHETAMINE 30 MG PO TABS
30.0000 mg | ORAL_TABLET | Freq: Two times a day (BID) | ORAL | 0 refills | Status: AC
  Filled 2024-08-12: qty 60, 30d supply, fill #0

## 2024-08-14 ENCOUNTER — Other Ambulatory Visit (HOSPITAL_COMMUNITY): Payer: Self-pay

## 2024-08-18 ENCOUNTER — Other Ambulatory Visit (HOSPITAL_COMMUNITY): Payer: Self-pay

## 2024-08-18 MED ORDER — FLUOCINOLONE ACETONIDE SCALP 0.01 % EX OIL
TOPICAL_OIL | CUTANEOUS | 2 refills | Status: AC
  Filled 2024-08-18: qty 118.28, 30d supply, fill #0

## 2024-08-19 ENCOUNTER — Other Ambulatory Visit (HOSPITAL_COMMUNITY): Payer: Self-pay

## 2024-08-31 ENCOUNTER — Other Ambulatory Visit (HOSPITAL_COMMUNITY): Payer: Self-pay
